# Patient Record
Sex: Female | Born: 1965 | Race: White | Hispanic: No | Marital: Married | Smoking: Former smoker
Health system: Southern US, Community
[De-identification: ages and names within clinical notes are randomized; demographics above are authoritative.]

## PROBLEM LIST (undated history)

## (undated) DIAGNOSIS — R072 Precordial pain: Secondary | ICD-10-CM

## (undated) DIAGNOSIS — M159 Polyosteoarthritis, unspecified: Secondary | ICD-10-CM

## (undated) DIAGNOSIS — E876 Hypokalemia: Secondary | ICD-10-CM

## (undated) DIAGNOSIS — F411 Generalized anxiety disorder: Secondary | ICD-10-CM

## (undated) DIAGNOSIS — I25119 Atherosclerotic heart disease of native coronary artery with unspecified angina pectoris: Secondary | ICD-10-CM

## (undated) DIAGNOSIS — R0602 Shortness of breath: Secondary | ICD-10-CM

## (undated) DIAGNOSIS — M5442 Lumbago with sciatica, left side: Secondary | ICD-10-CM

## (undated) DIAGNOSIS — M059 Rheumatoid arthritis with rheumatoid factor, unspecified: Secondary | ICD-10-CM

## (undated) DIAGNOSIS — Z720 Tobacco use: Secondary | ICD-10-CM

## (undated) DIAGNOSIS — M0580 Other rheumatoid arthritis with rheumatoid factor of unspecified site: Secondary | ICD-10-CM

## (undated) DIAGNOSIS — G629 Polyneuropathy, unspecified: Secondary | ICD-10-CM

## (undated) DIAGNOSIS — M545 Low back pain, unspecified: Secondary | ICD-10-CM

## (undated) DIAGNOSIS — K219 Gastro-esophageal reflux disease without esophagitis: Secondary | ICD-10-CM

## (undated) DIAGNOSIS — R0600 Dyspnea, unspecified: Secondary | ICD-10-CM

## (undated) DIAGNOSIS — F419 Anxiety disorder, unspecified: Secondary | ICD-10-CM

## (undated) DIAGNOSIS — N924 Excessive bleeding in the premenopausal period: Secondary | ICD-10-CM

## (undated) DIAGNOSIS — I10 Essential (primary) hypertension: Secondary | ICD-10-CM

## (undated) DIAGNOSIS — Z9109 Other allergy status, other than to drugs and biological substances: Secondary | ICD-10-CM

## (undated) DIAGNOSIS — E785 Hyperlipidemia, unspecified: Secondary | ICD-10-CM

## (undated) DIAGNOSIS — E7800 Pure hypercholesterolemia, unspecified: Secondary | ICD-10-CM

## (undated) DIAGNOSIS — J309 Allergic rhinitis, unspecified: Secondary | ICD-10-CM

## (undated) HISTORY — DX: Hyperlipidemia, unspecified: E78.5

## (undated) HISTORY — DX: Pure hypercholesterolemia, unspecified: E78.00

## (undated) HISTORY — DX: Polyosteoarthritis, unspecified: M15.9

## (undated) HISTORY — PX: CARDIAC CATHETERIZATION: SHX172

## (undated) HISTORY — DX: Other rheumatoid arthritis with rheumatoid factor of unspecified site: M05.80

## (undated) HISTORY — DX: Anxiety disorder, unspecified: F41.9

## (undated) HISTORY — DX: Low back pain, unspecified: M54.50

## (undated) HISTORY — DX: Allergic rhinitis, unspecified: J30.9

## (undated) HISTORY — PX: OTHER SURGICAL HISTORY: SHX169

## (undated) HISTORY — DX: Polyneuropathy, unspecified: G62.9

## (undated) HISTORY — DX: Dyspnea, unspecified: R06.00

## (undated) HISTORY — DX: Rheumatoid arthritis with rheumatoid factor, unspecified: M05.9

## (undated) HISTORY — DX: Gastro-esophageal reflux disease without esophagitis: K21.9

## (undated) HISTORY — DX: Other allergy status, other than to drugs and biological substances: Z91.09

## (undated) HISTORY — DX: Lumbago with sciatica, left side: M54.42

---
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
## 1997-09-07 ENCOUNTER — Ambulatory Visit (HOSPITAL_COMMUNITY): Admission: RE | Admit: 1997-09-07 | Discharge: 1997-09-07 | Payer: Self-pay | Admitting: *Deleted

## 1997-09-24 ENCOUNTER — Encounter: Admission: RE | Admit: 1997-09-24 | Discharge: 1997-09-24 | Payer: Self-pay | Admitting: Family Medicine

## 1997-09-26 ENCOUNTER — Encounter: Admission: RE | Admit: 1997-09-26 | Discharge: 1997-12-25 | Payer: Self-pay | Admitting: *Deleted

## 1997-10-25 ENCOUNTER — Encounter: Admission: RE | Admit: 1997-10-25 | Discharge: 1997-10-25 | Payer: Self-pay | Admitting: Family Medicine

## 1997-12-24 ENCOUNTER — Encounter: Admission: RE | Admit: 1997-12-24 | Discharge: 1997-12-24 | Payer: Self-pay | Admitting: Family Medicine

## 1998-01-10 ENCOUNTER — Encounter: Admission: RE | Admit: 1998-01-10 | Discharge: 1998-01-10 | Payer: Self-pay | Admitting: Family Medicine

## 1998-04-13 ENCOUNTER — Inpatient Hospital Stay (HOSPITAL_COMMUNITY): Admission: AD | Admit: 1998-04-13 | Discharge: 1998-04-13 | Payer: Self-pay | Admitting: *Deleted

## 1998-04-13 ENCOUNTER — Encounter: Payer: Self-pay | Admitting: *Deleted

## 1998-04-24 ENCOUNTER — Encounter: Admission: RE | Admit: 1998-04-24 | Discharge: 1998-04-24 | Payer: Self-pay | Admitting: Family Medicine

## 1998-05-13 ENCOUNTER — Encounter: Admission: RE | Admit: 1998-05-13 | Discharge: 1998-05-13 | Payer: Self-pay | Admitting: Family Medicine

## 1998-05-13 ENCOUNTER — Other Ambulatory Visit: Admission: RE | Admit: 1998-05-13 | Discharge: 1998-05-13 | Payer: Self-pay | Admitting: *Deleted

## 1998-06-12 ENCOUNTER — Encounter: Admission: RE | Admit: 1998-06-12 | Discharge: 1998-06-12 | Payer: Self-pay | Admitting: Sports Medicine

## 1998-06-19 ENCOUNTER — Encounter: Admission: RE | Admit: 1998-06-19 | Discharge: 1998-06-19 | Payer: Self-pay | Admitting: Family Medicine

## 1998-07-18 ENCOUNTER — Ambulatory Visit (HOSPITAL_COMMUNITY): Admission: RE | Admit: 1998-07-18 | Discharge: 1998-07-18 | Payer: Self-pay | Admitting: Family Medicine

## 1998-08-01 ENCOUNTER — Encounter: Admission: RE | Admit: 1998-08-01 | Discharge: 1998-08-01 | Payer: Self-pay | Admitting: Family Medicine

## 1998-08-22 ENCOUNTER — Encounter: Admission: RE | Admit: 1998-08-22 | Discharge: 1998-08-22 | Payer: Self-pay | Admitting: Family Medicine

## 1998-08-29 ENCOUNTER — Inpatient Hospital Stay (HOSPITAL_COMMUNITY): Admission: AD | Admit: 1998-08-29 | Discharge: 1998-08-29 | Payer: Self-pay | Admitting: Obstetrics & Gynecology

## 1998-09-11 ENCOUNTER — Encounter: Admission: RE | Admit: 1998-09-11 | Discharge: 1998-09-11 | Payer: Self-pay | Admitting: Family Medicine

## 1998-09-25 ENCOUNTER — Encounter: Payer: Self-pay | Admitting: Family Medicine

## 1998-09-25 ENCOUNTER — Inpatient Hospital Stay (HOSPITAL_COMMUNITY): Admission: RE | Admit: 1998-09-25 | Discharge: 1998-09-25 | Payer: Self-pay | Admitting: Family Medicine

## 1998-10-03 ENCOUNTER — Encounter: Admission: RE | Admit: 1998-10-03 | Discharge: 1998-10-03 | Payer: Self-pay | Admitting: Family Medicine

## 1998-10-03 ENCOUNTER — Inpatient Hospital Stay (HOSPITAL_COMMUNITY): Admission: AD | Admit: 1998-10-03 | Discharge: 1998-10-03 | Payer: Self-pay | Admitting: Obstetrics & Gynecology

## 1998-10-07 ENCOUNTER — Encounter: Admission: RE | Admit: 1998-10-07 | Discharge: 1998-10-07 | Payer: Self-pay | Admitting: Family Medicine

## 1998-10-10 ENCOUNTER — Encounter: Admission: RE | Admit: 1998-10-10 | Discharge: 1998-10-10 | Payer: Self-pay | Admitting: Family Medicine

## 1998-10-14 ENCOUNTER — Encounter: Admission: RE | Admit: 1998-10-14 | Discharge: 1998-10-14 | Payer: Self-pay | Admitting: Family Medicine

## 1998-10-16 ENCOUNTER — Encounter: Admission: RE | Admit: 1998-10-16 | Discharge: 1998-10-16 | Payer: Self-pay | Admitting: Family Medicine

## 1998-11-04 ENCOUNTER — Encounter: Admission: RE | Admit: 1998-11-04 | Discharge: 1998-11-04 | Payer: Self-pay | Admitting: Family Medicine

## 1998-11-10 ENCOUNTER — Inpatient Hospital Stay (HOSPITAL_COMMUNITY): Admission: AD | Admit: 1998-11-10 | Discharge: 1998-11-10 | Payer: Self-pay | Admitting: Obstetrics

## 1998-11-13 ENCOUNTER — Encounter: Admission: RE | Admit: 1998-11-13 | Discharge: 1998-11-13 | Payer: Self-pay | Admitting: Family Medicine

## 1998-11-17 ENCOUNTER — Inpatient Hospital Stay (HOSPITAL_COMMUNITY): Admission: AD | Admit: 1998-11-17 | Discharge: 1998-11-19 | Payer: Self-pay | Admitting: *Deleted

## 1998-11-20 ENCOUNTER — Encounter: Admission: RE | Admit: 1998-11-20 | Discharge: 1998-11-20 | Payer: Self-pay | Admitting: Family Medicine

## 1998-11-27 ENCOUNTER — Encounter: Admission: RE | Admit: 1998-11-27 | Discharge: 1998-11-27 | Payer: Self-pay | Admitting: Family Medicine

## 1999-02-27 ENCOUNTER — Other Ambulatory Visit: Admission: RE | Admit: 1999-02-27 | Discharge: 1999-02-27 | Payer: Self-pay | Admitting: *Deleted

## 1999-02-27 ENCOUNTER — Encounter: Admission: RE | Admit: 1999-02-27 | Discharge: 1999-02-27 | Payer: Self-pay | Admitting: Family Medicine

## 1999-03-18 ENCOUNTER — Encounter: Admission: RE | Admit: 1999-03-18 | Discharge: 1999-03-18 | Payer: Self-pay | Admitting: Family Medicine

## 1999-04-04 ENCOUNTER — Encounter: Admission: RE | Admit: 1999-04-04 | Discharge: 1999-04-04 | Payer: Self-pay | Admitting: Family Medicine

## 2003-06-14 ENCOUNTER — Emergency Department (HOSPITAL_COMMUNITY): Admission: EM | Admit: 2003-06-14 | Discharge: 2003-06-14 | Payer: Self-pay | Admitting: Emergency Medicine

## 2003-07-26 ENCOUNTER — Ambulatory Visit (HOSPITAL_COMMUNITY): Admission: RE | Admit: 2003-07-26 | Discharge: 2003-07-26 | Payer: Self-pay | Admitting: Family Medicine

## 2003-11-19 ENCOUNTER — Encounter: Admission: RE | Admit: 2003-11-19 | Discharge: 2003-12-19 | Payer: Self-pay | Admitting: Family Medicine

## 2003-12-26 ENCOUNTER — Ambulatory Visit (HOSPITAL_COMMUNITY): Admission: RE | Admit: 2003-12-26 | Discharge: 2003-12-26 | Payer: Self-pay | Admitting: Family Medicine

## 2004-01-14 ENCOUNTER — Ambulatory Visit (HOSPITAL_COMMUNITY): Admission: RE | Admit: 2004-01-14 | Discharge: 2004-01-14 | Payer: Self-pay | Admitting: Family Medicine

## 2004-05-06 ENCOUNTER — Ambulatory Visit: Payer: Self-pay | Admitting: Family Medicine

## 2004-06-24 ENCOUNTER — Ambulatory Visit: Payer: Self-pay | Admitting: Family Medicine

## 2004-07-10 ENCOUNTER — Ambulatory Visit: Payer: Self-pay | Admitting: Family Medicine

## 2004-07-30 ENCOUNTER — Ambulatory Visit (HOSPITAL_COMMUNITY): Admission: RE | Admit: 2004-07-30 | Discharge: 2004-07-30 | Payer: Self-pay | Admitting: Family Medicine

## 2004-09-11 ENCOUNTER — Ambulatory Visit: Payer: Self-pay | Admitting: Family Medicine

## 2005-09-28 ENCOUNTER — Ambulatory Visit: Payer: Self-pay | Admitting: Family Medicine

## 2005-10-02 ENCOUNTER — Ambulatory Visit (HOSPITAL_COMMUNITY): Admission: RE | Admit: 2005-10-02 | Discharge: 2005-10-02 | Payer: Self-pay | Admitting: Family Medicine

## 2006-07-26 ENCOUNTER — Ambulatory Visit: Payer: Self-pay | Admitting: Family Medicine

## 2006-08-16 ENCOUNTER — Ambulatory Visit (HOSPITAL_COMMUNITY): Admission: RE | Admit: 2006-08-16 | Discharge: 2006-08-16 | Payer: Self-pay | Admitting: Family Medicine

## 2007-01-21 ENCOUNTER — Ambulatory Visit: Payer: Self-pay | Admitting: Family Medicine

## 2007-01-27 ENCOUNTER — Ambulatory Visit (HOSPITAL_COMMUNITY): Admission: RE | Admit: 2007-01-27 | Discharge: 2007-01-27 | Payer: Self-pay | Admitting: Family Medicine

## 2007-02-15 ENCOUNTER — Ambulatory Visit: Payer: Self-pay | Admitting: Family Medicine

## 2007-03-24 ENCOUNTER — Ambulatory Visit: Payer: Self-pay | Admitting: Family Medicine

## 2007-04-07 ENCOUNTER — Ambulatory Visit (HOSPITAL_COMMUNITY): Admission: RE | Admit: 2007-04-07 | Discharge: 2007-04-07 | Payer: Self-pay | Admitting: Family Medicine

## 2007-05-23 ENCOUNTER — Encounter (INDEPENDENT_AMBULATORY_CARE_PROVIDER_SITE_OTHER): Payer: Self-pay | Admitting: Family Medicine

## 2007-05-23 LAB — CONVERTED CEMR LAB
Amphetamine Screen, Ur: NEGATIVE
Benzodiazepines.: NEGATIVE
Cocaine Metabolites: NEGATIVE
Marijuana Metabolite: POSITIVE — AB
Opiate Screen, Urine: NEGATIVE

## 2007-08-23 ENCOUNTER — Encounter: Admission: RE | Admit: 2007-08-23 | Discharge: 2007-08-23 | Payer: Self-pay | Admitting: Specialist

## 2007-11-07 ENCOUNTER — Emergency Department (HOSPITAL_COMMUNITY): Admission: EM | Admit: 2007-11-07 | Discharge: 2007-11-08 | Payer: Self-pay | Admitting: Emergency Medicine

## 2007-11-08 ENCOUNTER — Ambulatory Visit (HOSPITAL_COMMUNITY): Admission: RE | Admit: 2007-11-08 | Discharge: 2007-11-08 | Payer: Self-pay | Admitting: Family Medicine

## 2007-11-10 ENCOUNTER — Ambulatory Visit: Payer: Self-pay | Admitting: Family Medicine

## 2007-11-10 LAB — CONVERTED CEMR LAB
Barbiturate Quant, Ur: NEGATIVE
Cocaine Metabolites: NEGATIVE
Creatinine,U: 99.7 mg/dL
Marijuana Metabolite: POSITIVE — AB
Phencyclidine (PCP): NEGATIVE

## 2007-11-11 ENCOUNTER — Ambulatory Visit (HOSPITAL_COMMUNITY): Admission: RE | Admit: 2007-11-11 | Discharge: 2007-11-11 | Payer: Self-pay | Admitting: Family Medicine

## 2008-04-11 ENCOUNTER — Emergency Department (HOSPITAL_COMMUNITY): Admission: EM | Admit: 2008-04-11 | Discharge: 2008-04-11 | Payer: Self-pay | Admitting: Family Medicine

## 2008-04-22 ENCOUNTER — Emergency Department (HOSPITAL_COMMUNITY): Admission: EM | Admit: 2008-04-22 | Discharge: 2008-04-22 | Payer: Self-pay | Admitting: Emergency Medicine

## 2008-06-12 ENCOUNTER — Emergency Department (HOSPITAL_COMMUNITY): Admission: EM | Admit: 2008-06-12 | Discharge: 2008-06-12 | Payer: Self-pay | Admitting: Family Medicine

## 2008-08-24 ENCOUNTER — Encounter
Admission: RE | Admit: 2008-08-24 | Discharge: 2008-08-24 | Payer: Self-pay | Admitting: Physical Medicine & Rehabilitation

## 2009-04-05 ENCOUNTER — Emergency Department (HOSPITAL_COMMUNITY): Admission: EM | Admit: 2009-04-05 | Discharge: 2009-04-05 | Payer: Self-pay | Admitting: Emergency Medicine

## 2009-12-26 ENCOUNTER — Emergency Department (HOSPITAL_COMMUNITY): Admission: EM | Admit: 2009-12-26 | Discharge: 2009-12-26 | Payer: Self-pay | Admitting: Family Medicine

## 2010-07-06 ENCOUNTER — Encounter: Payer: Self-pay | Admitting: Physical Medicine and Rehabilitation

## 2010-07-06 ENCOUNTER — Encounter: Payer: Self-pay | Admitting: Family Medicine

## 2010-07-06 ENCOUNTER — Encounter: Payer: Self-pay | Admitting: Specialist

## 2012-07-13 ENCOUNTER — Emergency Department (HOSPITAL_COMMUNITY): Admission: EM | Admit: 2012-07-13 | Discharge: 2012-07-13 | Discharge status: Against Medical Advice | Payer: Self-pay

## 2014-12-19 ENCOUNTER — Encounter: Payer: Self-pay | Admitting: *Deleted

## 2014-12-19 ENCOUNTER — Other Ambulatory Visit: Payer: Self-pay | Admitting: *Deleted

## 2014-12-19 ENCOUNTER — Telehealth: Payer: Self-pay | Admitting: *Deleted

## 2014-12-19 DIAGNOSIS — N921 Excessive and frequent menstruation with irregular cycle: Secondary | ICD-10-CM

## 2014-12-19 NOTE — Telephone Encounter (Signed)
Attempted to contact patient to inform of referral and ultrasound appointment, no answer, unable to leave a message.  Will send a letter.

## 2014-12-19 NOTE — Progress Notes (Signed)
Ultrasound appointment scheduled for January 02, 2015 @0800 .

## 2014-12-25 ENCOUNTER — Encounter: Payer: Self-pay | Admitting: *Deleted

## 2014-12-31 ENCOUNTER — Ambulatory Visit (HOSPITAL_COMMUNITY)
Admission: RE | Admit: 2014-12-31 | Discharge: 2014-12-31 | Disposition: A | Discharge status: Home / Self Care | Payer: Medicaid Other | Source: Ambulatory Visit | Attending: Obstetrics & Gynecology | Admitting: Obstetrics & Gynecology

## 2014-12-31 ENCOUNTER — Ambulatory Visit (HOSPITAL_COMMUNITY): Admission: RE | Admit: 2014-12-31 | Payer: Medicaid Other | Source: Ambulatory Visit

## 2014-12-31 ENCOUNTER — Other Ambulatory Visit (HOSPITAL_COMMUNITY): Payer: Medicaid Other

## 2014-12-31 DIAGNOSIS — N921 Excessive and frequent menstruation with irregular cycle: Secondary | ICD-10-CM

## 2014-12-31 DIAGNOSIS — N939 Abnormal uterine and vaginal bleeding, unspecified: Secondary | ICD-10-CM | POA: Insufficient documentation

## 2015-01-01 ENCOUNTER — Ambulatory Visit (HOSPITAL_COMMUNITY): Payer: Medicaid Other

## 2015-01-02 ENCOUNTER — Ambulatory Visit (HOSPITAL_COMMUNITY): Payer: Self-pay

## 2015-01-09 ENCOUNTER — Other Ambulatory Visit (HOSPITAL_COMMUNITY)
Admission: RE | Admit: 2015-01-09 | Discharge: 2015-01-09 | Disposition: A | Discharge status: Home / Self Care | Payer: Medicaid Other | Source: Ambulatory Visit | Attending: Obstetrics & Gynecology | Admitting: Obstetrics & Gynecology

## 2015-01-09 ENCOUNTER — Encounter: Payer: Self-pay | Admitting: Obstetrics & Gynecology

## 2015-01-09 ENCOUNTER — Ambulatory Visit (INDEPENDENT_AMBULATORY_CARE_PROVIDER_SITE_OTHER): Payer: Medicaid Other | Admitting: Obstetrics & Gynecology

## 2015-01-09 VITALS — BP 129/78 | HR 67 | Temp 97.8°F | Ht 67.0 in | Wt 179.2 lb

## 2015-01-09 DIAGNOSIS — N924 Excessive bleeding in the premenopausal period: Secondary | ICD-10-CM | POA: Diagnosis not present

## 2015-01-09 DIAGNOSIS — Z1151 Encounter for screening for human papillomavirus (HPV): Secondary | ICD-10-CM | POA: Insufficient documentation

## 2015-01-09 DIAGNOSIS — Z01419 Encounter for gynecological examination (general) (routine) without abnormal findings: Secondary | ICD-10-CM | POA: Insufficient documentation

## 2015-01-09 DIAGNOSIS — Z139 Encounter for screening, unspecified: Secondary | ICD-10-CM

## 2015-01-09 HISTORY — DX: Excessive bleeding in the premenopausal period: N92.4

## 2015-01-09 LAB — CBC
HEMATOCRIT: 46.3 % — AB (ref 36.0–46.0)
Hemoglobin: 16 g/dL — ABNORMAL HIGH (ref 12.0–15.0)
MCH: 32.9 pg (ref 26.0–34.0)
MCHC: 34.6 g/dL (ref 30.0–36.0)
MCV: 95.1 fL (ref 78.0–100.0)
MPV: 10.3 fL (ref 8.6–12.4)
Platelets: 151 10*3/uL (ref 150–400)
RBC: 4.87 MIL/uL (ref 3.87–5.11)
RDW: 13.3 % (ref 11.5–15.5)
WBC: 4.9 10*3/uL (ref 4.0–10.5)

## 2015-01-09 LAB — POCT PREGNANCY, URINE: PREG TEST UR: NEGATIVE

## 2015-01-09 MED ORDER — MEDROXYPROGESTERONE ACETATE 10 MG PO TABS
10.0000 mg | ORAL_TABLET | Freq: Every day | ORAL | Status: DC
Start: 2015-01-09 — End: 2021-09-04

## 2015-01-09 NOTE — Progress Notes (Signed)
Patient ID: Shelby Huffman, female   DOB: 10-05-1965, 49 y.o.   MRN: [id number]  Chief Complaint  Patient presents with  . Menorrhagia  bled for more than a month 10/2014-12/2014  HPI Shelby Huffman is a 49 y.o. female.  [id number], Patient's last menstrual period was 12/20/2014.'  Menses stopped early July after bleeding from late May til then. HPI  Past Medical History  Diagnosis Date  . Hypercholesteremia   . Anxiety   . Environmental allergies     Past Surgical History  Procedure Laterality Date  . Bilateral tubal ligation      Family History  Problem Relation Age of Onset  . Breast cancer Mother   . Hypertension Father     Social History History  Substance Use Topics  . Smoking status: Current Every Day Smoker -- 1.00 packs/day    Types: Cigarettes  . Smokeless tobacco: Never Used  . Alcohol Use: No    Allergies  Allergen Reactions  . Penicillins Hives    Current Outpatient Prescriptions  Medication Sig Dispense Refill  . ALPRAZolam (XANAX) 1 MG tablet Take 1 mg by mouth 3 (three) times daily.  0  . hydrochlorothiazide (HYDRODIURIL) 25 MG tablet Take 25 mg by mouth daily.  5  . HYDROcodone-acetaminophen (NORCO) 10-325 MG per tablet Take 1 tablet by mouth 3 (three) times daily.  0  . loratadine (CLARITIN) 10 MG tablet Take 10 mg by mouth daily.  5  . metoprolol tartrate (LOPRESSOR) 25 MG tablet Take 25 mg by mouth daily.  0  . pravastatin (PRAVACHOL) 20 MG tablet Take 20 mg by mouth at bedtime.  5  . medroxyPROGESTERone (PROVERA) 10 MG tablet Take 1 tablet (10 mg total) by mouth daily. Use for ten days if 6-8 weeks since last period 10 tablet 2   No current facility-administered medications for this visit.    Review of Systems Review of Systems  Respiratory: Negative.   Cardiovascular: Negative.   Gastrointestinal: Negative.   Genitourinary: Positive for menstrual problem. Negative for vaginal bleeding and vaginal discharge.    Blood pressure  129/78, pulse 67, temperature 97.8 F (36.6 C), height  (1.702 m), weight 179 lb 3.2 oz (81.285 kg), last menstrual period 12/20/2014.  Physical Exam Physical Exam  Constitutional: She is oriented to person, place, and time. She appears well-developed. No distress.  Pulmonary/Chest: Effort normal. No respiratory distress.  Abdominal: Soft.  Genitourinary: Uterus normal. Vaginal discharge found.  Pelvic exam: normal external genitalia, vulva, vagina, cervix, uterus and adnexa.   Neurological: She is alert and oriented to person, place, and time.  Skin: Skin is warm and dry.  Psychiatric: She has a normal mood and affect. Her behavior is normal.    Data Reviewed CLINICAL DATA: Initial encounter for abnormal uterine bleeding.  EXAM: TRANSABDOMINAL AND TRANSVAGINAL ULTRASOUND OF PELVIS  TECHNIQUE: Both transabdominal and transvaginal ultrasound examinations of the pelvis were performed. Transabdominal technique was performed for global imaging of the pelvis including uterus, ovaries, adnexal regions, and pelvic cul-de-sac. It was necessary to proceed with endovaginal exam following the transabdominal exam to visualize the endometrium.  COMPARISON: 04/07/2007  FINDINGS: Uterus  Measurements: 11.6 x 5.5 x 7.3 cm. No fibroids or other mass visualized.  Endometrium  Thickness: 12 mm. No focal abnormality visualized.  Right ovary  Measurements: 2.5 x 2.0 x 1.8 cm. Normal appearance/no adnexal mass.  Left ovary  Measurements: 2.4 x 1.3 x 2.3 cm. Normal appearance/no adnexal mass.  Other findings  No free  fluid.  IMPRESSION: 12 mm endometrial stripe thickness in a premenopausal female. If bleeding remains unresponsive to hormonal or medical therapy, sonohysterogram should be considered for focal lesion work-up. (Ref: Radiological Reasoning: Algorithmic Workup of Abnormal Vaginal Bleeding with Endovaginal Sonography and Sonohysterography.  AJR 2008; 130:Q65-78)   Electronically Signed  By: Kennith Center M.D.  On: 12/31/2014 10:44  Assessment   Perimenopausal DUB currently resolved   Plan    FSH, CBC Provera 10 mg 10 days for missed period for 6-8 weeks RTC 6 mo to review        Shelby Huffman 01/09/2015, 1:44 PM

## 2015-01-09 NOTE — Patient Instructions (Signed)

## 2015-01-10 LAB — FOLLICLE STIMULATING HORMONE: FSH: 9.3 m[IU]/mL

## 2015-01-11 ENCOUNTER — Ambulatory Visit (HOSPITAL_COMMUNITY)
Admission: RE | Admit: 2015-01-11 | Discharge: 2015-01-11 | Disposition: A | Discharge status: Home / Self Care | Payer: Medicaid Other | Source: Ambulatory Visit | Attending: Obstetrics & Gynecology | Admitting: Obstetrics & Gynecology

## 2015-01-11 DIAGNOSIS — Z139 Encounter for screening, unspecified: Secondary | ICD-10-CM

## 2015-01-11 DIAGNOSIS — Z1231 Encounter for screening mammogram for malignant neoplasm of breast: Secondary | ICD-10-CM | POA: Insufficient documentation

## 2015-01-11 DIAGNOSIS — N924 Excessive bleeding in the premenopausal period: Secondary | ICD-10-CM

## 2015-01-14 ENCOUNTER — Telehealth: Payer: Self-pay | Admitting: *Deleted

## 2015-01-14 LAB — CYTOLOGY - PAP

## 2015-01-14 NOTE — Telephone Encounter (Signed)
-----   Message from Adam Phenix, MD sent at 01/13/2015  3:39 PM EDT ----- [facility] premenopausal

## 2015-01-14 NOTE — Telephone Encounter (Signed)
Contacted patient, informed of lab result.  Pt verbalizes understanding and has no further questions.

## 2016-03-05 DIAGNOSIS — G8929 Other chronic pain: Secondary | ICD-10-CM

## 2016-03-05 HISTORY — DX: Other chronic pain: G89.29

## 2016-04-15 DIAGNOSIS — F411 Generalized anxiety disorder: Secondary | ICD-10-CM

## 2016-04-15 HISTORY — DX: Generalized anxiety disorder: F41.1

## 2016-05-17 DIAGNOSIS — E876 Hypokalemia: Secondary | ICD-10-CM | POA: Insufficient documentation

## 2016-05-17 DIAGNOSIS — I10 Essential (primary) hypertension: Secondary | ICD-10-CM | POA: Insufficient documentation

## 2016-05-17 DIAGNOSIS — R0602 Shortness of breath: Secondary | ICD-10-CM | POA: Insufficient documentation

## 2016-05-17 DIAGNOSIS — E785 Hyperlipidemia, unspecified: Secondary | ICD-10-CM | POA: Insufficient documentation

## 2016-05-17 DIAGNOSIS — R072 Precordial pain: Secondary | ICD-10-CM | POA: Insufficient documentation

## 2016-05-17 DIAGNOSIS — Z720 Tobacco use: Secondary | ICD-10-CM

## 2016-05-17 HISTORY — DX: Tobacco use: Z72.0

## 2016-05-17 HISTORY — DX: Hypokalemia: E87.6

## 2016-05-17 HISTORY — DX: Essential (primary) hypertension: I10

## 2016-05-17 HISTORY — DX: Shortness of breath: R06.02

## 2016-05-17 HISTORY — DX: Hyperlipidemia, unspecified: E78.5

## 2016-05-17 HISTORY — DX: Precordial pain: R07.2

## 2016-05-25 IMAGING — US US TRANSVAGINAL NON-OB
1 series · 13 of 25 positions shown · non-contrast
Comparison: 04/07/2007

CLINICAL DATA: Initial encounter for abnormal uterine bleeding.



[Series 1: us non-ob tv/pel · 13 of 48 slices shown]
[im 1/48]
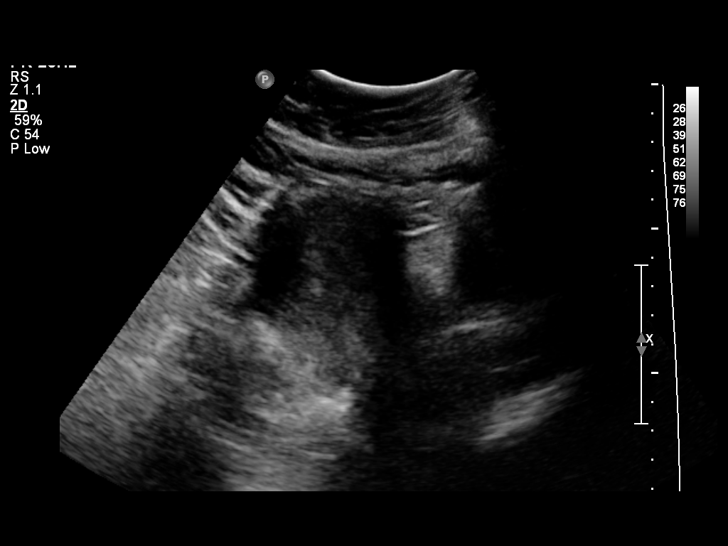
[im 4/48]
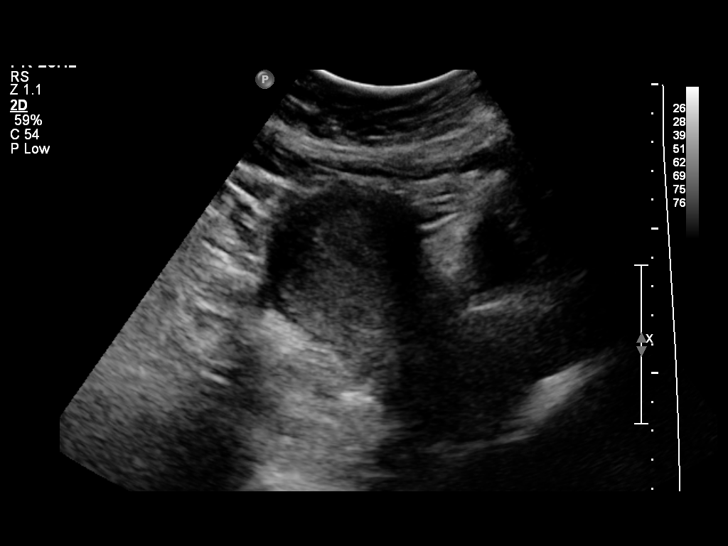
[im 8/48]
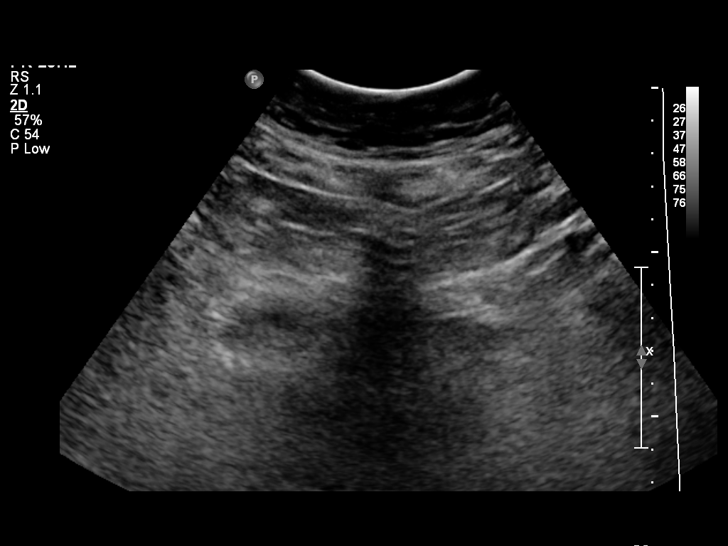
[im 12/48]
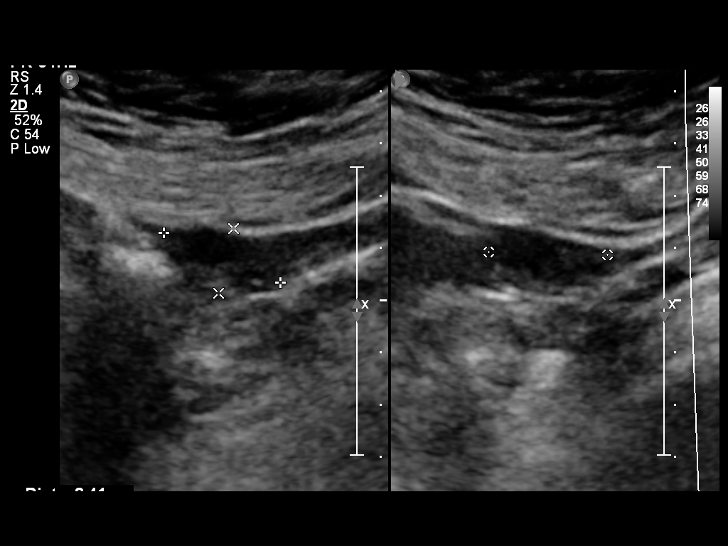
[im 16/48]
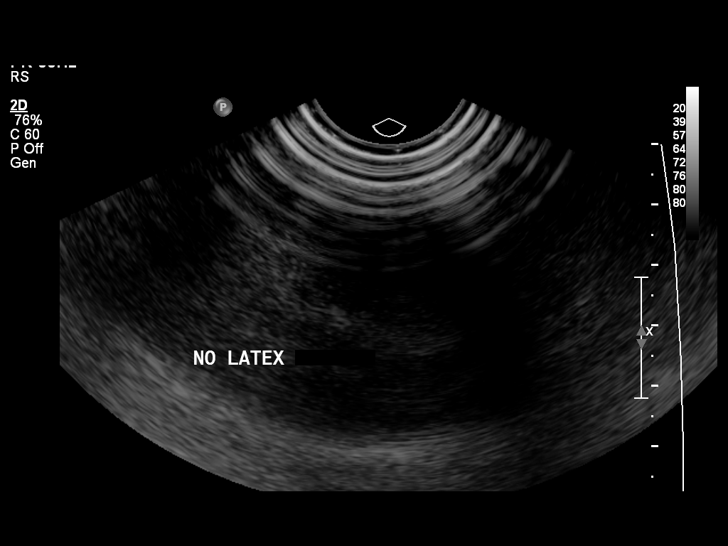
[im 20/48]
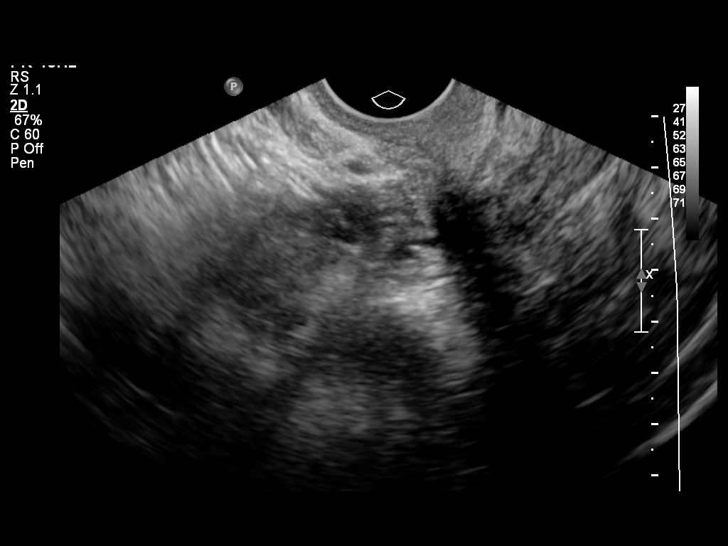
[im 24/48]
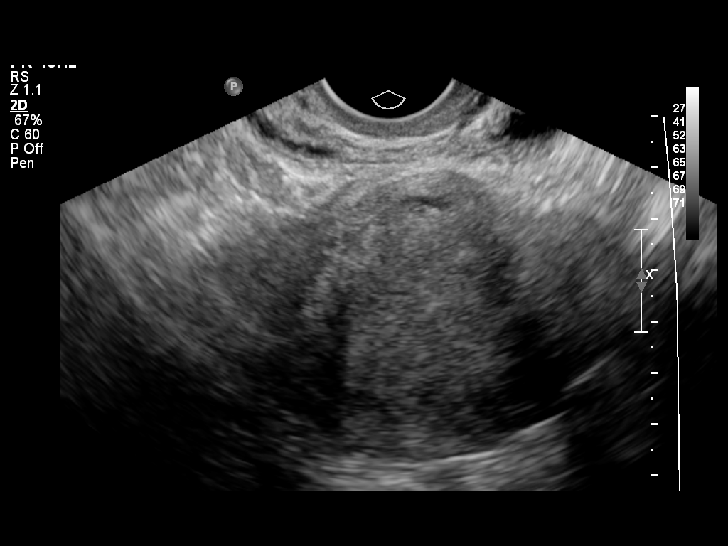
[im 28/48]
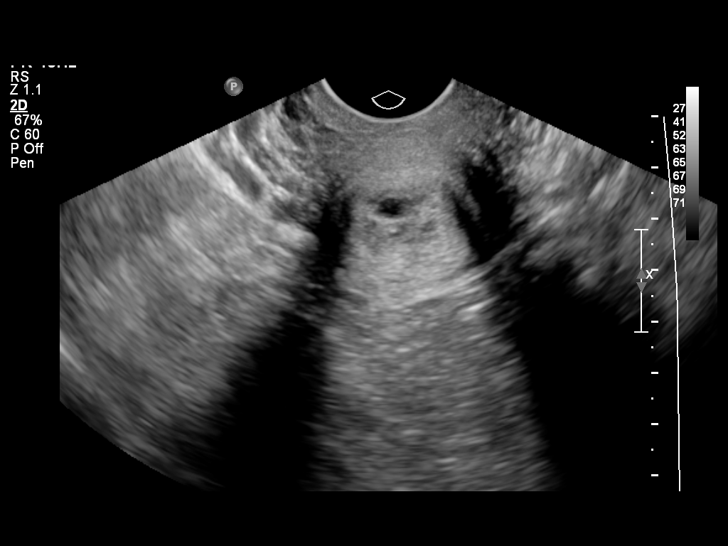
[im 32/48]
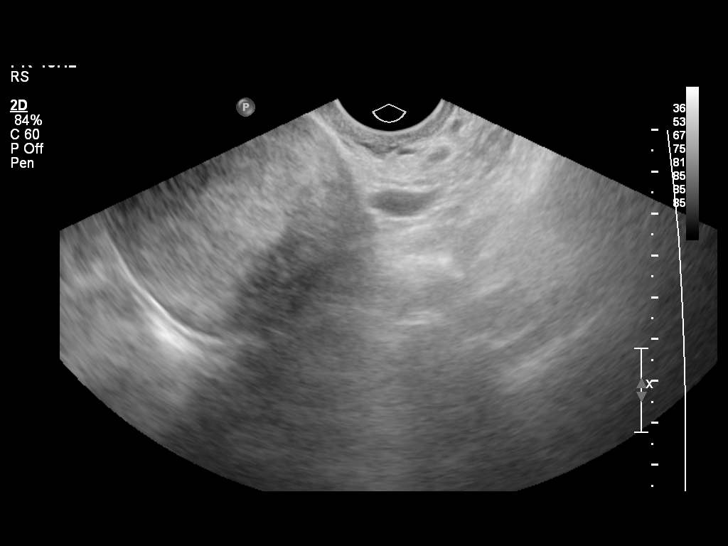
[im 36/48]
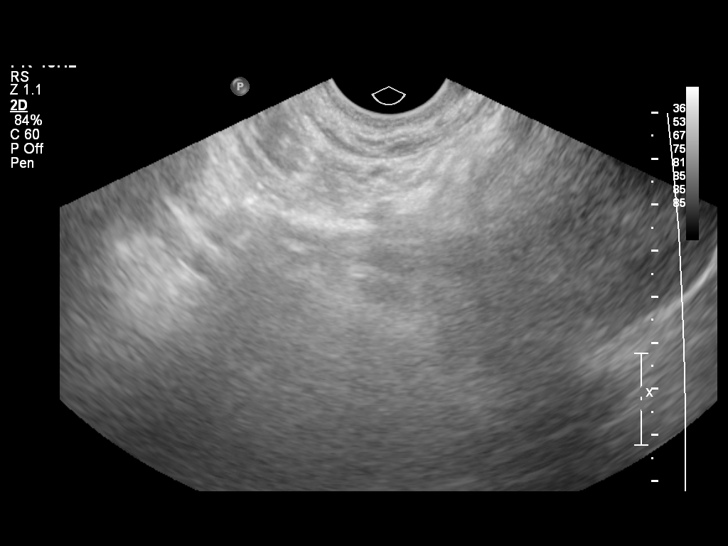
[im 40/48]
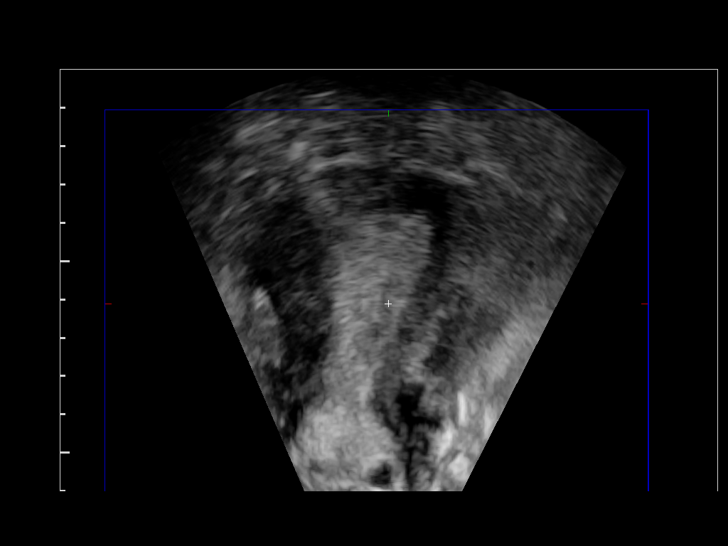
[im 44/48]
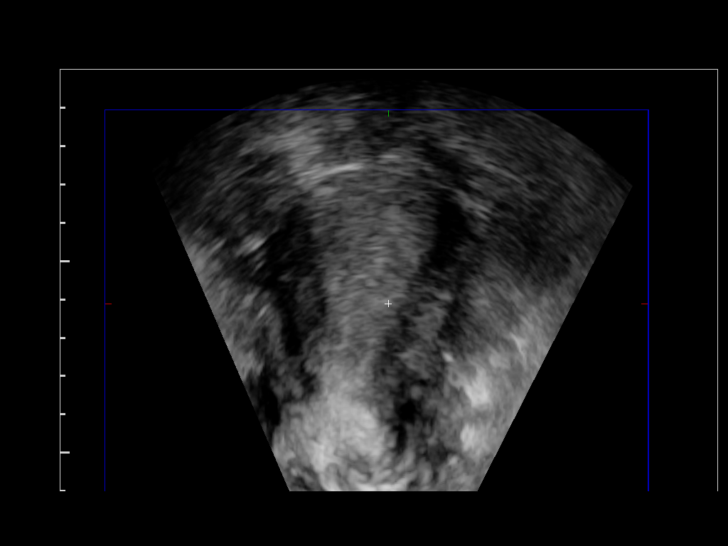
[im 48/48]
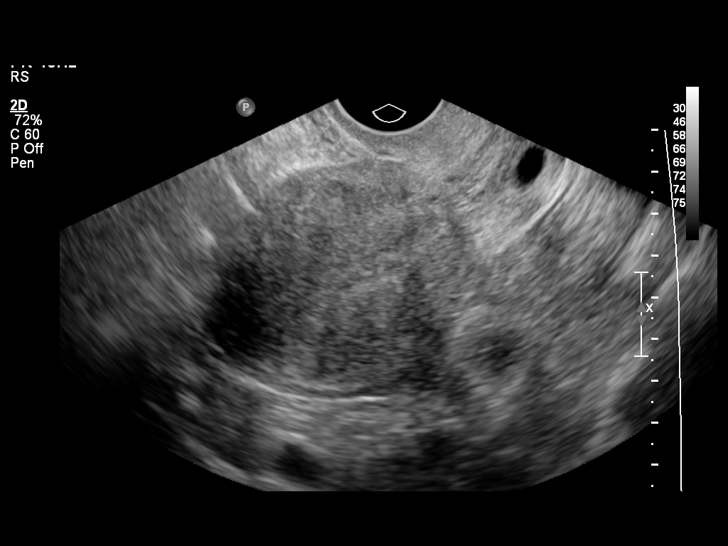

[13 of 25 positions shown; findings below may reference images not displayed]

FINDINGS: Uterus

Measurements: 11.6 x 5.5 x 7.3 cm. No fibroids or other mass
visualized.

Endometrium

Thickness: 12 mm.  No focal abnormality visualized.

Right ovary

Measurements: 2.5 x 2.0 x 1.8 cm. Normal appearance/no adnexal mass.

Left ovary

Measurements: 2.4 x 1.3 x 2.3 cm. Normal appearance/no adnexal mass.

Other findings

No free fluid.
IMPRESSION: 12 mm endometrial stripe thickness in a premenopausal female. If
bleeding remains unresponsive to hormonal or medical therapy,
sonohysterogram should be considered for focal lesion work-up. (Ref:
Radiological Reasoning: Algorithmic Workup of Abnormal Vaginal
Bleeding with Endovaginal Sonography and Sonohysterography. AJR
6666; 191:S68-73)

## 2016-06-03 DIAGNOSIS — I25119 Atherosclerotic heart disease of native coronary artery with unspecified angina pectoris: Secondary | ICD-10-CM | POA: Insufficient documentation

## 2016-06-03 HISTORY — DX: Atherosclerotic heart disease of native coronary artery with unspecified angina pectoris: I25.119

## 2019-01-17 ENCOUNTER — Encounter: Payer: Self-pay | Admitting: *Deleted

## 2019-01-17 ENCOUNTER — Other Ambulatory Visit: Payer: Self-pay

## 2019-01-17 ENCOUNTER — Encounter: Payer: Self-pay | Admitting: Cardiology

## 2019-01-17 ENCOUNTER — Ambulatory Visit (INDEPENDENT_AMBULATORY_CARE_PROVIDER_SITE_OTHER): Payer: Medicaid Other | Admitting: Cardiology

## 2019-01-17 VITALS — BP 124/80 | HR 68 | Temp 98.4°F | Ht 67.0 in | Wt 184.0 lb

## 2019-01-17 DIAGNOSIS — E7800 Pure hypercholesterolemia, unspecified: Secondary | ICD-10-CM | POA: Insufficient documentation

## 2019-01-17 DIAGNOSIS — M545 Low back pain, unspecified: Secondary | ICD-10-CM | POA: Insufficient documentation

## 2019-01-17 DIAGNOSIS — I25119 Atherosclerotic heart disease of native coronary artery with unspecified angina pectoris: Secondary | ICD-10-CM

## 2019-01-17 DIAGNOSIS — I10 Essential (primary) hypertension: Secondary | ICD-10-CM

## 2019-01-17 DIAGNOSIS — G629 Polyneuropathy, unspecified: Secondary | ICD-10-CM | POA: Insufficient documentation

## 2019-01-17 DIAGNOSIS — R0602 Shortness of breath: Secondary | ICD-10-CM | POA: Diagnosis not present

## 2019-01-17 DIAGNOSIS — R12 Heartburn: Secondary | ICD-10-CM | POA: Insufficient documentation

## 2019-01-17 DIAGNOSIS — R0600 Dyspnea, unspecified: Secondary | ICD-10-CM | POA: Insufficient documentation

## 2019-01-17 DIAGNOSIS — K219 Gastro-esophageal reflux disease without esophagitis: Secondary | ICD-10-CM | POA: Insufficient documentation

## 2019-01-17 DIAGNOSIS — M159 Polyosteoarthritis, unspecified: Secondary | ICD-10-CM | POA: Insufficient documentation

## 2019-01-17 DIAGNOSIS — Z9109 Other allergy status, other than to drugs and biological substances: Secondary | ICD-10-CM | POA: Insufficient documentation

## 2019-01-17 DIAGNOSIS — F419 Anxiety disorder, unspecified: Secondary | ICD-10-CM | POA: Insufficient documentation

## 2019-01-17 DIAGNOSIS — J309 Allergic rhinitis, unspecified: Secondary | ICD-10-CM | POA: Insufficient documentation

## 2019-01-17 DIAGNOSIS — M059 Rheumatoid arthritis with rheumatoid factor, unspecified: Secondary | ICD-10-CM | POA: Insufficient documentation

## 2019-01-17 DIAGNOSIS — M0580 Other rheumatoid arthritis with rheumatoid factor of unspecified site: Secondary | ICD-10-CM | POA: Insufficient documentation

## 2019-01-17 DIAGNOSIS — R079 Chest pain, unspecified: Secondary | ICD-10-CM

## 2019-01-17 DIAGNOSIS — M5442 Lumbago with sciatica, left side: Secondary | ICD-10-CM | POA: Insufficient documentation

## 2019-01-17 HISTORY — DX: Heartburn: R12

## 2019-01-17 MED ORDER — PANTOPRAZOLE SODIUM 40 MG PO TBEC: 40.0000 mg | DELAYED_RELEASE_TABLET | Freq: Every day | ORAL | 2 refills | Status: DC

## 2019-01-17 MED ORDER — NITROGLYCERIN 0.4 MG SL SUBL: 0.4000 mg | SUBLINGUAL_TABLET | SUBLINGUAL | 11 refills | Status: DC | PRN

## 2019-01-17 NOTE — Progress Notes (Signed)
Cardiology Office Note:    Date:  01/17/2019   ID:  Shelby Huffman, DOB 05/31/1966, MRN [id number]  PCP:  Clelia CroftGeorge, Robert, MD  Cardiologist:  Norman HerrlichBrian Anais Koenen, MD   Referring MD: Clelia CroftGeorge, Robert, MD  ASSESSMENT:    1. SOB (shortness of breath)   2. Coronary artery disease involving native coronary artery of native heart with angina pectoris (HCC)   3. Essential hypertension   4. Hypercholesteremia    PLAN:    In order of problems listed above:  1. Differential diagnosis includes anginal equivalent underlying lung disease especially COPD with impressive cigarette smoking history and noncardiac problems including anemia and thyroid disease.  Further evaluation chest x-ray proBNP level myocardial perfusion study CBC and thyroid. 2. I am concerned that her shortness of breath is anginal equivalent continue current medical treatment including dual antiplatelet and do myocardial perfusion study to evaluate for ischemia.  If abnormal will refer to coronary angiography 3. Stable continue current treatment beta-blocker thiazide diuretic 4. Continue her statin check lipid profile liver function LP(a) 5. Heartburn start a PPI  Next appointment 6 weeks   Medication Adjustments/Labs and Tests Ordered: Current medicines are reviewed at length with the patient today.  Concerns regarding medicines are outlined above.  No orders of the defined types were placed in this encounter.  No orders of the defined types were placed in this encounter.    Chief Complaint  Patient presents with  . Shortness of Breath  . Coronary Artery Disease    History of Present Illness:    Shelby Huffman is a 53 y.o. female with a history of CAD and PCI of LAD 05/17/2016, HTN and HLD who is being seen today for the evaluation of CAD, dyspnea, and to establish cardiology care at the request of Clelia CroftGeorge, Robert, MD. She follows with her PCp for additional history of generalized anxiety disorder, polyosteoarthritis,  rheumatoid arthritis, chronic low back pain. Has been referred to rheumatology.   She is a longstanding tobacco user. Cardiac cath 05/2016 showing severe 2 vessel CAD normal LVEF requiring DES to proximal to mid and mid to distal LAD. Moderate to severe 60-70% mid to distal RCA disease. She was noted to be intolerant to Brilinta and changed to Plavix.   She has not felt well for a long period of time and feels worse recently.  The stressor in her life is grandchildren at home she cares for them.  With emotional upset she develops a sensation in her chest shortness of breath anxiety and has to stop for a minute or 2 to find relief.  She is not short of breath with activity and will quite use the word chest pain.  Overall she does not feel well with weakness and fatigue and she has had no medical care in the last year.  I am concerned she is having recurrent cardiac ischemia for further evaluation should undergo myocardial perfusion studies also check routine labs including liver function renal CBC thyroid lipid profile and a proBNP.  She had a chest x-ray performed if she continues to smoke 1-1/2 packs a day for 38 years.  She is also concerned about bruising and for the time being we will leave her on low-dose aspirin and clopidogrel until we see the results of testing.  No edema orthopnea palpitation or syncope.  Is also have a constant sensation of heartburn in her throat.  Past Medical History:  Diagnosis Date  . Abnormal perimenopausal bleeding 01/09/2015  . Allergic rhinitis   .  Anxiety   . Coronary artery disease involving native coronary artery of native heart with angina pectoris ([place]) 06/03/2016  . Dyslipidemia 05/17/2016  . Dyspnea, unspecified   . Environmental allergies   . Essential hypertension 05/17/2016  . GAD (generalized anxiety disorder) 04/15/2016  . GERD (gastroesophageal reflux disease)   . Hyperlipidemia   . Hypokalemia 05/17/2016  . Low back pain   . Lumbago with sciatica,  left side   . Neuropathy   . Polyosteoarthritis   . Precordial pain 05/17/2016  . Rheumatoid arthritis with rheumatoid factor (HCC)   . SOB (shortness of breath) 05/17/2016  . Tobacco abuse 05/17/2016    Past Surgical History:  Procedure Laterality Date  . bilateral tubal ligation    . CARDIAC CATHETERIZATION     Stent placed    Current Medications: Current Meds  Medication Sig  . albuterol (VENTOLIN HFA) 108 (90 Base) MCG/ACT inhaler Inhale into the lungs.  . ASPIRIN LOW DOSE 81 MG EC tablet Take 81 mg by mouth daily.  . clopidogrel (PLAVIX) 75 MG tablet Take 75 mg by mouth daily.  Marland Kitchen ezetimibe (ZETIA) 10 MG tablet Take 10 mg by mouth daily.  . hydrochlorothiazide (HYDRODIURIL) 25 MG tablet Take 25 mg by mouth daily.  Marland Kitchen KLOR-CON M20 20 MEQ tablet Take 20 mEq by mouth daily. with food  . loratadine (CLARITIN) 10 MG tablet Take 10 mg by mouth daily.  . medroxyPROGESTERone (PROVERA) 10 MG tablet Take 1 tablet (10 mg total) by mouth daily. Use for ten days if 6-8 weeks since last period  . metoprolol succinate (TOPROL-XL) 25 MG 24 hr tablet TAKE 1 TABLET BY MOUTH EVERY DAY  . metoprolol tartrate (LOPRESSOR) 25 MG tablet Take 25 mg by mouth daily.  Marland Kitchen oxyCODONE (ROXICODONE) 15 MG immediate release tablet Take 15 mg by mouth every 8 (eight) hours as needed.  . pravastatin (PRAVACHOL) 20 MG tablet Take 20 mg by mouth at bedtime.  . Vitamin D, Ergocalciferol, (DRISDOL) 1.25 MG (50000 UT) CAPS capsule Take 50,000 Units by mouth once a week.     Allergies:   Atorvastatin, Duloxetine, Sulfamethazine, Penicillins, Morphine, and Sulfa antibiotics   Social History   Socioeconomic History  . Marital status: Married    Spouse name: Not on file  . Number of children: Not on file  . Years of education: Not on file  . Highest education level: Not on file  Occupational History  . Not on file  Social Needs  . Financial resource strain: Not on file  . Food insecurity    Worry: Not on file     Inability: Not on file  . Transportation needs    Medical: Not on file    Non-medical: Not on file  Tobacco Use  . Smoking status: Current Every Day Smoker    Packs/day: 1.00    Types: Cigarettes  . Smokeless tobacco: Never Used  Substance and Sexual Activity  . Alcohol use: No  . Drug use: No  . Sexual activity: Never    Birth control/protection: Surgical  Lifestyle  . Physical activity    Days per week: Not on file    Minutes per session: Not on file  . Stress: Not on file  Relationships  . Social Herbalist on phone: Not on file    Gets together: Not on file    Attends religious service: Not on file    Active member of club or organization: Not on file    Attends  meetings of clubs or organizations: Not on file    Relationship status: Not on file  Other Topics Concern  . Not on file  Social History Narrative  . Not on file     Family History: The patient's family history includes Breast cancer in her mother; Heart disease in her father; Hypertension in her father and mother; Kidney disease in her father.  ROS:   Review of Systems  Constitution: Positive for malaise/fatigue.  HENT: Negative.   Eyes: Negative.   Cardiovascular: Positive for dyspnea on exertion.  Respiratory: Positive for shortness of breath.   Endocrine: Negative.   Hematologic/Lymphatic: Negative.   Skin: Negative.   Musculoskeletal: Negative.   Gastrointestinal: Positive for heartburn.  Genitourinary: Negative.   Neurological: Negative.   Psychiatric/Behavioral: Negative.   Allergic/Immunologic: Negative.    Please see the history of present illness.     All other systems reviewed and are negative.  EKGs/Labs/Other Studies Reviewed:    The following studies were reviewed today:  Left heart cath 06/03/2016:    Hemodynamics: Aortic pressure 115/53 mmHg, left ventricle pressure 160/10 mmHg, left ventricular end-diastolic pressure of 10 mmHg   Coronary anatomy:  1. Left Main  coronary artery: Normal 2. Left anterior descending artery: Proximal to mid eccentric 98% subtotaled disease with TIMI II flow distally. There is another 75% disease in the mid to distal region and another 50% near the apex. There are couple of small diagonal branches seen. 3. Left circumflex artery: Large-caliber vessel in the proximal region and in the mid to distal region is very small vessel. OM1 is a large-caliber trifurcating vessel without any significant disease. 4. Right coronary artery: Large-caliber dominant ectatic vessel which had minor luminal irregularities throughout with mid 25% and another distal 60-70% stenosis followed by aneurysm dilation just before the bifurcation. LV gram performed showed mild apical hypokinesis with left ventricular ejection fraction of more than 55%. No mitral regurgitation noted. No significant gradient across the aortic valve on pullback. Preintervention showed 98% subtotaled stenosis of the proximal to mid LAD which is at type C lesion with TIMI II flow distally. Final result showed 0% residual stenosis with no dissection or intracoronary thrombus seen with TIMI III flow distally. Preintervention showed 75% eccentric stenosis of the mid to distal LAD which is at type C lesion with TIMI III flow distally. Final result showed 0% residual stenosis with no dissection or intracoronary thrombus seen with TIMI III flow distally.  CONCLUSIONS: 1. Severe 2 vessel CAD with culprit vessel LAD. 2. Normal LVEF. 3. Successful PTCA/stent placement x2 of the proximal to mid and mid to distal LAD using Synergy drug-eluting stents.  EKG:  EKG is  ordered today.  The ekg ordered today is personally reviewed and demonstrates sinus rhythm and is normal  Recent Labs: No results found for requested labs within last 8760 hours.   Recent Lipid Panel Component Name 05/18/2016   157         Chol  267 (H)    TG  27 (L)      HDL  77            LDL  53 (H)       VLDL        Physical Exam:    VS:  BP 124/80 (BP Location: Left Arm, Patient Position: Sitting, Cuff Size: Normal)   Pulse 68   Temp 98.4 F (36.9 C)   Ht 5\' 7"  (1.702 m)   Wt 184 lb (83.5 kg)  SpO2 97%   BMI 28.82 kg/m     Wt Readings from Last 3 Encounters:  01/17/19 184 lb (83.5 kg)  01/09/15 179 lb 3.2 oz (81.3 kg)     GEN:  Well nourished, well developed in no acute distress HEENT: Normal NECK: No JVD; No carotid bruits LYMPHATICS: No lymphadenopathy CARDIAC: RRR, no murmurs, rubs, gallops RESPIRATORY:  Clear to auscultation without rales, wheezing or rhonchi  ABDOMEN: Soft, non-tender, non-distended MUSCULOSKELETAL:  No edema; No deformity  SKIN: Warm and dry NEUROLOGIC:  Alert and oriented x 3 PSYCHIATRIC:  Normal affect   Signed, Norman HerrlichBrian Analyn Matusek, MD  01/17/2019 10:20 AM    [facility]

## 2019-01-17 NOTE — Patient Instructions (Addendum)
Medication Instructions:  Your physician has recommended you make the following change in your medication:   Nitroglycerin 0.4 mg sublingual (under your tongue) as needed for chest pain. If experiencing chest pain, stop what you are doing and sit down. Take 1 nitroglycerin and wait 5 minutes. If chest pain continues, take another nitroglycerin and wait 5 minutes. If chest pain does not subside, take 1 more nitroglycerin and dial 911. You make take a total of 3 nitroglycerin in a 15 minute time frame.   START: Protonix 40 mg: Take 1 tab daily with meal  If you need a refill on your cardiac medications before your next appointment, please call your pharmacy.   Lab work: Your physician recommends that you return for lab work in: TODAY CBC,CMP,TSH,Pro BNP,Liproprotein A  If you have labs (blood work) drawn today and your tests are completely normal, you will receive your results only by: Marland Kitchen MyChart Message (if you have MyChart) OR . A paper copy in the mail If you have any lab test that is abnormal or we need to change your treatment, we will call you to review the results.  Testing/Procedures: A chest x-ray takes a picture of the organs and structures inside the chest, including the heart, lungs, and blood vessels. This test can show several things, including, whether the heart is enlarges; whether fluid is building up in the lungs; and whether pacemaker / defibrillator leads are still in place.  AT [facility]  Your physician has requested that you have a lexiscan myoview. For further information please visit [email or web address]. Please follow instruction sheet, as given.  YOUR APPOINTMENT IS  SEPT 9th,2020 AT 11:00 at this office Please arrive at 10:45AM Follow-Up: At [facility], you and your health needs are our priority.  As part of our continuing mission to provide you with exceptional heart care, we have created designated Provider Care Teams.  These Care Teams include your  primary Cardiologist (physician) and Advanced Practice Providers (APPs -  Physician Assistants and Nurse Practitioners) who all work together to provide you with the care you need, when you need it. You will need a follow up appointment in 4 weeks.  Please call our office 2 months in advance to schedule this appointment.  You may see No primary care provider on file. or another member of our [employer] Provider Team in [place]: Jenne Campus, MD . Jyl Heinz, MD  Any Other Special Instructions Will Be Listed Below (If Applicable).

## 2019-01-18 ENCOUNTER — Telehealth: Payer: Self-pay | Admitting: *Deleted

## 2019-01-18 LAB — COMPREHENSIVE METABOLIC PANEL
ALT: 14 IU/L (ref 0–32)
AST: 19 IU/L (ref 0–40)
Albumin/Globulin Ratio: 1.8 (ref 1.2–2.2)
Albumin: 4.2 g/dL (ref 3.8–4.9)
Alkaline Phosphatase: 78 IU/L (ref 39–117)
BUN/Creatinine Ratio: 20 (ref 9–23)
BUN: 19 mg/dL (ref 6–24)
Bilirubin Total: 0.5 mg/dL (ref 0.0–1.2)
CO2: 26 mmol/L (ref 20–29)
Calcium: 9.3 mg/dL (ref 8.7–10.2)
Chloride: 100 mmol/L (ref 96–106)
Creatinine, Ser: 0.97 mg/dL (ref 0.57–1.00)
GFR calc Af Amer: 77 mL/min/{1.73_m2} (ref >59)
GFR calc non Af Amer: 67 mL/min/{1.73_m2} (ref >59)
Globulin, Total: 2.3 g/dL (ref 1.5–4.5)
Glucose: 91 mg/dL (ref 65–99)
Potassium: 3.9 mmol/L (ref 3.5–5.2)
Sodium: 140 mmol/L (ref 134–144)
Total Protein: 6.5 g/dL (ref 6.0–8.5)

## 2019-01-18 LAB — CBC
Hematocrit: 49.6 % — ABNORMAL HIGH (ref 34.0–46.6)
Hemoglobin: 16.5 g/dL — ABNORMAL HIGH (ref 11.1–15.9)
MCH: 30.9 pg (ref 26.6–33.0)
MCHC: 33.3 g/dL (ref 31.5–35.7)
MCV: 93 fL (ref 79–97)
Platelets: 155 10*3/uL (ref 150–450)
RBC: 5.34 x10E6/uL — ABNORMAL HIGH (ref 3.77–5.28)
RDW: 12.9 % (ref 11.7–15.4)
WBC: 3.8 10*3/uL (ref 3.4–10.8)

## 2019-01-18 LAB — LIPOPROTEIN A (LPA): Lipoprotein (a): 13.2 nmol/L (ref <75.0)

## 2019-01-18 LAB — TSH: TSH: 1.95 u[IU]/mL (ref 0.450–4.500)

## 2019-01-18 LAB — PRO B NATRIURETIC PEPTIDE: NT-Pro BNP: 168 pg/mL (ref 0–249)

## 2019-01-18 NOTE — Telephone Encounter (Signed)
Telephone call to patient. Informed of stable/normal lab results.

## 2019-01-18 NOTE — Telephone Encounter (Signed)
-----   Message from Richardo Priest, MD sent at 01/18/2019  1:56 PM EDT ----- Normal or stable result

## 2019-02-16 ENCOUNTER — Telehealth (HOSPITAL_COMMUNITY): Payer: Self-pay | Admitting: *Deleted

## 2019-02-16 NOTE — Telephone Encounter (Signed)
Follow up ° ° °Patient is returning your call. Please call. ° ° ° °

## 2019-02-16 NOTE — Telephone Encounter (Signed)
Left message on voicemail in reference to upcoming appointment scheduled for 02/22/19. Phone number given for a call back so details instructions can be given. Shelby Huffman Jacqueline   

## 2019-02-21 ENCOUNTER — Telehealth: Payer: Self-pay | Admitting: *Deleted

## 2019-02-21 NOTE — Telephone Encounter (Signed)
Patient given detailed instructions per Myocardial Perfusion Study Information Sheet for the test on 02/22/19 Patient notified to arrive 15 minutes early and that it is imperative to arrive on time for appointment to keep from having the test rescheduled.  If you need to cancel or reschedule your appointment, please call the office within 24 hours of your appointment. . Patient verbalized understanding. Shelby Huffman    

## 2019-02-22 ENCOUNTER — Other Ambulatory Visit: Payer: Self-pay

## 2019-02-22 ENCOUNTER — Ambulatory Visit (INDEPENDENT_AMBULATORY_CARE_PROVIDER_SITE_OTHER): Payer: Medicaid Other

## 2019-02-22 DIAGNOSIS — R079 Chest pain, unspecified: Secondary | ICD-10-CM | POA: Diagnosis not present

## 2019-02-22 MED ORDER — REGADENOSON 0.4 MG/5ML IV SOLN
0.4000 mg | Freq: Once | INTRAVENOUS | Status: AC
  Administered 2019-02-22: 0.4 mg via INTRAVENOUS

## 2019-02-22 MED ORDER — TECHNETIUM TC 99M TETROFOSMIN IV KIT
30.0000 | PACK | Freq: Once | INTRAVENOUS | Status: AC | PRN
  Administered 2019-02-22: 30 via INTRAVENOUS

## 2019-02-22 MED ORDER — AMINOPHYLLINE 25 MG/ML IV SOLN
75.0000 mg | Freq: Once | INTRAVENOUS | Status: AC
  Administered 2019-02-22: 75 mg via INTRAVENOUS

## 2019-02-22 MED ORDER — TECHNETIUM TC 99M TETROFOSMIN IV KIT
9.6000 | PACK | Freq: Once | INTRAVENOUS | Status: AC | PRN
  Administered 2019-02-22: 9.6 via INTRAVENOUS

## 2019-02-23 LAB — MYOCARDIAL PERFUSION IMAGING
LV dias vol: 69 mL (ref 46–106)
LV sys vol: 25 mL
Peak HR: 105 {beats}/min
Rest HR: 60 {beats}/min
SDS: 2
SRS: 4
SSS: 6
TID: 1.32

## 2019-03-02 ENCOUNTER — Ambulatory Visit: Payer: Medicaid Other | Admitting: Family

## 2019-03-02 ENCOUNTER — Telehealth: Payer: Self-pay | Admitting: Family

## 2019-03-02 ENCOUNTER — Encounter: Payer: Self-pay | Admitting: Family

## 2019-03-02 NOTE — Telephone Encounter (Signed)
Left message for Mrs. Leoni to call us back regarding her missed appointment on 03/02/19 at 1:15PM.   Loel Dubonnet, NP

## 2019-05-08 ENCOUNTER — Other Ambulatory Visit: Payer: Self-pay | Admitting: Cardiology

## 2019-05-09 NOTE — Telephone Encounter (Signed)
Pantoprazole refill sent to [facility]  In [place], requires follow-up visit

## 2019-05-31 ENCOUNTER — Ambulatory Visit: Payer: Medicaid Other | Admitting: Cardiology

## 2019-06-11 ENCOUNTER — Other Ambulatory Visit: Payer: Self-pay | Admitting: Cardiology

## 2019-07-04 ENCOUNTER — Other Ambulatory Visit: Payer: Self-pay | Admitting: *Deleted

## 2019-07-04 MED ORDER — PANTOPRAZOLE SODIUM 40 MG PO TBEC: 40.0000 mg | DELAYED_RELEASE_TABLET | Freq: Every day | ORAL | 1 refills | Status: DC

## 2020-06-21 ENCOUNTER — Other Ambulatory Visit: Payer: Self-pay | Admitting: Cardiology

## 2020-12-17 NOTE — Progress Notes (Deleted)
Cardiology Office Note:    Date:  12/17/2020   ID:  Shelby Huffman, DOB [birth year], MRN [id number]  PCP:  Galvin Proffer, MD  Cardiologist:  Norman Herrlich, MD    Referring MD: Galvin Proffer, MD    ASSESSMENT:    No diagnosis found. PLAN:    In order of problems listed above:  ***   Next appointment: ***   Medication Adjustments/Labs and Tests Ordered: Current medicines are reviewed at length with the patient today.  Concerns regarding medicines are outlined above.  No orders of the defined types were placed in this encounter.  No orders of the defined types were placed in this encounter.   No chief complaint on file.   History of Present Illness:    Shelby Huffman is a 55 y.o. female with a hx of CAD with PCI of the LAD December 2017 hypertension and hyper lipidemia last seen 01/17/2019 with exertional shortness of breath.  Additional medical problems include generalized anxiety disorder probably osteoarthritis rheumatoid arthritis chronic low back pain.  Coronary angiography December 2017 showed severe two-vessel CAD normal ejection fraction with PCI and stent to the mid and distal LAD and 60 to 70% mid to distal right coronary artery stenosis.. Compliance with diet, lifestyle and medications: ***  She had a gated Myoview study performed 02/22/2019 showing EF of 64% and no evidence of ischemia no ischemic EKG changes normal blood pressure response and no chest pain. Past Medical History:  Diagnosis Date   Abnormal perimenopausal bleeding 01/09/2015   Allergic rhinitis    Anxiety    Coronary artery disease involving native coronary artery of native heart with angina pectoris (HCC) 06/03/2016   (a) 05/2016 DES to LAD and residual 60-70% stenosis in RCA   Dyslipidemia 05/17/2016   Dyspnea, unspecified    Environmental allergies    Essential hypertension 05/17/2016   GAD (generalized anxiety disorder) 04/15/2016   GERD (gastroesophageal reflux disease)    Hyperlipidemia     Hypokalemia 05/17/2016   Low back pain    Lumbago with sciatica, left side    Neuropathy    Polyosteoarthritis    Precordial pain 05/17/2016   Rheumatoid arthritis with rheumatoid factor (HCC)    SOB (shortness of breath) 05/17/2016   Tobacco abuse 05/17/2016    Past Surgical History:  Procedure Laterality Date   bilateral tubal ligation     CARDIAC CATHETERIZATION     Stent placed    Current Medications: No outpatient medications have been marked as taking for the 12/18/20 encounter (Appointment) with Baldo Daub, MD.     Allergies:   Atorvastatin, Duloxetine, Sulfamethazine, Penicillins, Morphine, and Sulfa antibiotics   Social History   Socioeconomic History   Marital status: Married    Spouse name: Not on file   Number of children: Not on file   Years of education: Not on file   Highest education level: Not on file  Occupational History   Not on file  Tobacco Use   Smoking status: Every Day    Packs/day: 1.00    Pack years: 0.00    Types: Cigarettes   Smokeless tobacco: Never  Substance and Sexual Activity   Alcohol use: No   Drug use: No   Sexual activity: Never    Birth control/protection: Surgical  Other Topics Concern   Not on file  Social History Narrative   Not on file   Social Determinants of Health   Financial Resource Strain: Not on file  Food Insecurity: Not  on file  Transportation Needs: Not on file  Physical Activity: Not on file  Stress: Not on file  Social Connections: Not on file     Family History: The patient's ***family history includes Breast cancer in her mother; Heart disease in her father; Hypertension in her father and mother; Kidney disease in her father. ROS:   Please see the history of present illness.    All other systems reviewed and are negative.  EKGs/Labs/Other Studies Reviewed:    The following studies were reviewed today:  EKG:  EKG ordered today and personally reviewed.  The ekg ordered today demonstrates  ***  Recent Labs: No results found for requested labs within last 8760 hours.  Recent Lipid Panel No results found for: CHOL, TRIG, HDL, CHOLHDL, VLDL, LDLCALC, LDLDIRECT  Physical Exam:    VS:  There were no vitals taken for this visit.    Wt Readings from Last 3 Encounters:  02/22/19 184 lb (83.5 kg)  01/17/19 184 lb (83.5 kg)  01/09/15 179 lb 3.2 oz (81.3 kg)     GEN: *** Well nourished, well developed in no acute distress HEENT: Normal NECK: No JVD; No carotid bruits LYMPHATICS: No lymphadenopathy CARDIAC: ***RRR, no murmurs, rubs, gallops RESPIRATORY:  Clear to auscultation without rales, wheezing or rhonchi  ABDOMEN: Soft, non-tender, non-distended MUSCULOSKELETAL:  No edema; No deformity  SKIN: Warm and dry NEUROLOGIC:  Alert and oriented x 3 PSYCHIATRIC:  Normal affect    Signed, Norman Herrlich, MD  12/17/2020 12:39 PM    [facility]

## 2020-12-18 ENCOUNTER — Ambulatory Visit: Payer: Medicaid Other | Admitting: Cardiology

## 2021-09-01 DIAGNOSIS — I2510 Atherosclerotic heart disease of native coronary artery without angina pectoris: Secondary | ICD-10-CM | POA: Diagnosis not present

## 2021-09-01 DIAGNOSIS — I10 Essential (primary) hypertension: Secondary | ICD-10-CM | POA: Diagnosis not present

## 2021-09-01 DIAGNOSIS — Z720 Tobacco use: Secondary | ICD-10-CM

## 2021-09-01 DIAGNOSIS — R079 Chest pain, unspecified: Secondary | ICD-10-CM | POA: Diagnosis not present

## 2021-09-01 DIAGNOSIS — E785 Hyperlipidemia, unspecified: Secondary | ICD-10-CM | POA: Diagnosis not present

## 2021-09-01 DIAGNOSIS — R011 Cardiac murmur, unspecified: Secondary | ICD-10-CM | POA: Diagnosis not present

## 2021-09-01 NOTE — H&P (Signed)
No data in chart concerning symptoms was admitted for cath. ?

## 2021-09-02 ENCOUNTER — Inpatient Hospital Stay (HOSPITAL_COMMUNITY)
Admission: AD | Admit: 2021-09-02 | Discharge: 2021-09-04 | DRG: 247 | Disposition: A | Discharge status: Home / Self Care | Payer: Medicaid Other | Attending: Interventional Cardiology | Admitting: Interventional Cardiology

## 2021-09-02 ENCOUNTER — Encounter (HOSPITAL_COMMUNITY)
Admission: AD | Disposition: A | Discharge status: Home / Self Care | Payer: Self-pay | Source: Home / Self Care | Attending: Interventional Cardiology

## 2021-09-02 ENCOUNTER — Encounter (HOSPITAL_COMMUNITY): Payer: Self-pay | Admitting: Interventional Cardiology

## 2021-09-02 DIAGNOSIS — J309 Allergic rhinitis, unspecified: Secondary | ICD-10-CM | POA: Diagnosis present

## 2021-09-02 DIAGNOSIS — F411 Generalized anxiety disorder: Secondary | ICD-10-CM | POA: Diagnosis present

## 2021-09-02 DIAGNOSIS — G8929 Other chronic pain: Secondary | ICD-10-CM | POA: Diagnosis present

## 2021-09-02 DIAGNOSIS — Z8249 Family history of ischemic heart disease and other diseases of the circulatory system: Secondary | ICD-10-CM | POA: Diagnosis not present

## 2021-09-02 DIAGNOSIS — Z9851 Tubal ligation status: Secondary | ICD-10-CM

## 2021-09-02 DIAGNOSIS — Z882 Allergy status to sulfonamides status: Secondary | ICD-10-CM

## 2021-09-02 DIAGNOSIS — Z955 Presence of coronary angioplasty implant and graft: Secondary | ICD-10-CM | POA: Diagnosis not present

## 2021-09-02 DIAGNOSIS — Z79899 Other long term (current) drug therapy: Secondary | ICD-10-CM | POA: Diagnosis not present

## 2021-09-02 DIAGNOSIS — M5442 Lumbago with sciatica, left side: Secondary | ICD-10-CM | POA: Diagnosis present

## 2021-09-02 DIAGNOSIS — K219 Gastro-esophageal reflux disease without esophagitis: Secondary | ICD-10-CM | POA: Diagnosis present

## 2021-09-02 DIAGNOSIS — Z880 Allergy status to penicillin: Secondary | ICD-10-CM | POA: Diagnosis not present

## 2021-09-02 DIAGNOSIS — E785 Hyperlipidemia, unspecified: Secondary | ICD-10-CM | POA: Diagnosis present

## 2021-09-02 DIAGNOSIS — I200 Unstable angina: Secondary | ICD-10-CM

## 2021-09-02 DIAGNOSIS — Z7982 Long term (current) use of aspirin: Secondary | ICD-10-CM | POA: Diagnosis not present

## 2021-09-02 DIAGNOSIS — M059 Rheumatoid arthritis with rheumatoid factor, unspecified: Secondary | ICD-10-CM | POA: Diagnosis present

## 2021-09-02 DIAGNOSIS — Z888 Allergy status to other drugs, medicaments and biological substances status: Secondary | ICD-10-CM | POA: Diagnosis not present

## 2021-09-02 DIAGNOSIS — M0580 Other rheumatoid arthritis with rheumatoid factor of unspecified site: Secondary | ICD-10-CM | POA: Diagnosis present

## 2021-09-02 DIAGNOSIS — D696 Thrombocytopenia, unspecified: Secondary | ICD-10-CM | POA: Diagnosis not present

## 2021-09-02 DIAGNOSIS — F17210 Nicotine dependence, cigarettes, uncomplicated: Secondary | ICD-10-CM | POA: Diagnosis present

## 2021-09-02 DIAGNOSIS — I25110 Atherosclerotic heart disease of native coronary artery with unstable angina pectoris: Principal | ICD-10-CM | POA: Diagnosis present

## 2021-09-02 DIAGNOSIS — Z885 Allergy status to narcotic agent status: Secondary | ICD-10-CM

## 2021-09-02 DIAGNOSIS — R079 Chest pain, unspecified: Secondary | ICD-10-CM | POA: Diagnosis not present

## 2021-09-02 DIAGNOSIS — N289 Disorder of kidney and ureter, unspecified: Secondary | ICD-10-CM | POA: Diagnosis not present

## 2021-09-02 DIAGNOSIS — R0602 Shortness of breath: Secondary | ICD-10-CM | POA: Diagnosis not present

## 2021-09-02 DIAGNOSIS — I25119 Atherosclerotic heart disease of native coronary artery with unspecified angina pectoris: Secondary | ICD-10-CM

## 2021-09-02 DIAGNOSIS — Z720 Tobacco use: Secondary | ICD-10-CM | POA: Diagnosis not present

## 2021-09-02 DIAGNOSIS — I10 Essential (primary) hypertension: Secondary | ICD-10-CM | POA: Diagnosis present

## 2021-09-02 HISTORY — PX: INTRAVASCULAR PRESSURE WIRE/FFR STUDY: CATH118243

## 2021-09-02 HISTORY — PX: LEFT HEART CATH AND CORONARY ANGIOGRAPHY: CATH118249

## 2021-09-02 HISTORY — DX: Unstable angina: I20.0

## 2021-09-02 LAB — POCT ACTIVATED CLOTTING TIME
Activated Clotting Time: 0 seconds
Activated Clotting Time: 323 seconds

## 2021-09-02 SURGERY — LEFT HEART CATH AND CORONARY ANGIOGRAPHY
Anesthesia: LOCAL

## 2021-09-02 MED ORDER — LIDOCAINE HCL (PF) 1 % IJ SOLN
INTRAMUSCULAR | Status: DC | PRN
Start: 2021-09-02 — End: 2021-09-02
  Administered 2021-09-02: 2 mL

## 2021-09-02 MED ORDER — ASPIRIN 81 MG PO CHEW
81.0000 mg | CHEWABLE_TABLET | ORAL | Status: AC
  Administered 2021-09-03: 81 mg via ORAL
  Filled 2021-09-02: qty 1

## 2021-09-02 MED ORDER — IOHEXOL 350 MG/ML SOLN
INTRAVENOUS | Status: DC | PRN
  Administered 2021-09-02: 130 mL

## 2021-09-02 MED ORDER — CLOPIDOGREL BISULFATE 75 MG PO TABS
150.0000 mg | ORAL_TABLET | Freq: Once | ORAL | Status: AC
  Administered 2021-09-02: 150 mg via ORAL

## 2021-09-02 MED ORDER — ONDANSETRON HCL 4 MG/2ML IJ SOLN: 4.0000 mg | Freq: Four times a day (QID) | INTRAMUSCULAR | Status: DC | PRN

## 2021-09-02 MED ORDER — SODIUM CHLORIDE 0.9 % IV SOLN: INTRAVENOUS | Status: AC

## 2021-09-02 MED ORDER — HYDROCHLOROTHIAZIDE 25 MG PO TABS
25.0000 mg | ORAL_TABLET | Freq: Every day | ORAL | Status: DC
Start: 2021-09-03 — End: 2021-09-03

## 2021-09-02 MED ORDER — METOPROLOL SUCCINATE ER 25 MG PO TB24
25.0000 mg | ORAL_TABLET | Freq: Every day | ORAL | Status: DC
  Administered 2021-09-02 – 2021-09-04 (×3): 25 mg via ORAL
  Filled 2021-09-02 (×3): qty 1

## 2021-09-02 MED ORDER — FENTANYL CITRATE (PF) 100 MCG/2ML IJ SOLN
INTRAMUSCULAR | Status: AC
  Filled 2021-09-02: qty 2

## 2021-09-02 MED ORDER — HEPARIN (PORCINE) IN NACL 1000-0.9 UT/500ML-% IV SOLN
INTRAVENOUS | Status: DC | PRN
  Administered 2021-09-02 (×2): 500 mL

## 2021-09-02 MED ORDER — CLOPIDOGREL BISULFATE 75 MG PO TABS
75.0000 mg | ORAL_TABLET | Freq: Every day | ORAL | Status: DC
  Administered 2021-09-03 – 2021-09-04 (×2): 75 mg via ORAL
  Filled 2021-09-02 (×2): qty 1

## 2021-09-02 MED ORDER — ASPIRIN 81 MG PO CHEW: 81.0000 mg | CHEWABLE_TABLET | Freq: Every day | ORAL | Status: DC

## 2021-09-02 MED ORDER — FENTANYL CITRATE (PF) 100 MCG/2ML IJ SOLN
INTRAMUSCULAR | Status: DC | PRN
  Administered 2021-09-02: 50 ug via INTRAVENOUS
  Administered 2021-09-02: 25 ug via INTRAVENOUS

## 2021-09-02 MED ORDER — MIDAZOLAM HCL 2 MG/2ML IJ SOLN
INTRAMUSCULAR | Status: AC
  Filled 2021-09-02: qty 2

## 2021-09-02 MED ORDER — LABETALOL HCL 5 MG/ML IV SOLN: 10.0000 mg | INTRAVENOUS | Status: AC | PRN

## 2021-09-02 MED ORDER — ALBUTEROL SULFATE HFA 108 (90 BASE) MCG/ACT IN AERS: 2.0000 | INHALATION_SPRAY | Freq: Four times a day (QID) | RESPIRATORY_TRACT | Status: DC

## 2021-09-02 MED ORDER — HEPARIN SODIUM (PORCINE) 1000 UNIT/ML IJ SOLN
INTRAMUSCULAR | Status: AC
  Filled 2021-09-02: qty 10

## 2021-09-02 MED ORDER — SODIUM CHLORIDE 0.9% FLUSH
3.0000 mL | Freq: Two times a day (BID) | INTRAVENOUS | Status: DC
  Administered 2021-09-02: 3 mL via INTRAVENOUS

## 2021-09-02 MED ORDER — HYDRALAZINE HCL 20 MG/ML IJ SOLN: 10.0000 mg | INTRAMUSCULAR | Status: AC | PRN

## 2021-09-02 MED ORDER — LORATADINE 10 MG PO TABS
10.0000 mg | ORAL_TABLET | Freq: Every day | ORAL | Status: DC
  Administered 2021-09-02 – 2021-09-04 (×3): 10 mg via ORAL
  Filled 2021-09-02 (×3): qty 1

## 2021-09-02 MED ORDER — POTASSIUM CHLORIDE CRYS ER 20 MEQ PO TBCR
20.0000 meq | EXTENDED_RELEASE_TABLET | Freq: Every day | ORAL | Status: DC
  Administered 2021-09-02: 20 meq via ORAL
  Filled 2021-09-02: qty 1

## 2021-09-02 MED ORDER — SODIUM CHLORIDE 0.9 % WEIGHT BASED INFUSION
1.0000 mL/kg/h | INTRAVENOUS | Status: DC
  Administered 2021-09-02: 1 mL/kg/h via INTRAVENOUS

## 2021-09-02 MED ORDER — HEPARIN (PORCINE) IN NACL 1000-0.9 UT/500ML-% IV SOLN
INTRAVENOUS | Status: AC
  Filled 2021-09-02: qty 1000

## 2021-09-02 MED ORDER — HEPARIN (PORCINE) 25000 UT/250ML-% IV SOLN
1000.0000 [IU]/h | INTRAVENOUS | Status: DC
  Administered 2021-09-02: 1000 [IU]/h via INTRAVENOUS
  Filled 2021-09-02: qty 250

## 2021-09-02 MED ORDER — LIDOCAINE HCL (PF) 1 % IJ SOLN
INTRAMUSCULAR | Status: AC
  Filled 2021-09-02: qty 30

## 2021-09-02 MED ORDER — HEPARIN SODIUM (PORCINE) 1000 UNIT/ML IJ SOLN
INTRAMUSCULAR | Status: DC | PRN
  Administered 2021-09-02: 5000 [IU] via INTRAVENOUS
  Administered 2021-09-02: 3500 [IU] via INTRAVENOUS

## 2021-09-02 MED ORDER — NITROGLYCERIN IN D5W 200-5 MCG/ML-% IV SOLN
0.0000 ug/min | INTRAVENOUS | Status: DC
  Administered 2021-09-02: 20 ug/min via INTRAVENOUS

## 2021-09-02 MED ORDER — NITROGLYCERIN IN D5W 200-5 MCG/ML-% IV SOLN
INTRAVENOUS | Status: AC | PRN
  Administered 2021-09-02: 10 ug/min via INTRAVENOUS

## 2021-09-02 MED ORDER — NITROGLYCERIN 1 MG/10 ML FOR IR/CATH LAB
INTRA_ARTERIAL | Status: AC
  Filled 2021-09-02: qty 10

## 2021-09-02 MED ORDER — NITROGLYCERIN IN D5W 200-5 MCG/ML-% IV SOLN
INTRAVENOUS | Status: AC
  Filled 2021-09-02: qty 250

## 2021-09-02 MED ORDER — MIDAZOLAM HCL 2 MG/2ML IJ SOLN
INTRAMUSCULAR | Status: DC | PRN
  Administered 2021-09-02: 1 mg via INTRAVENOUS

## 2021-09-02 MED ORDER — CLOPIDOGREL BISULFATE 75 MG PO TABS
ORAL_TABLET | ORAL | Status: AC
  Filled 2021-09-02: qty 2

## 2021-09-02 MED ORDER — NITROGLYCERIN 1 MG/10 ML FOR IR/CATH LAB
INTRA_ARTERIAL | Status: DC | PRN
  Administered 2021-09-02: 200 ug via INTRACORONARY

## 2021-09-02 MED ORDER — SODIUM CHLORIDE 0.9 % IV SOLN
INTRAVENOUS | Status: AC | PRN
  Administered 2021-09-02: 10 mL/h via INTRAVENOUS

## 2021-09-02 MED ORDER — OXYCODONE HCL 5 MG PO TABS
5.0000 mg | ORAL_TABLET | ORAL | Status: DC | PRN
  Administered 2021-09-02 – 2021-09-03 (×4): 10 mg via ORAL
  Filled 2021-09-02 (×5): qty 2

## 2021-09-02 MED ORDER — ACETAMINOPHEN 325 MG PO TABS
650.0000 mg | ORAL_TABLET | ORAL | Status: DC | PRN
  Administered 2021-09-03 – 2021-09-04 (×2): 650 mg via ORAL
  Filled 2021-09-02 (×4): qty 2

## 2021-09-02 MED ORDER — VERAPAMIL HCL 2.5 MG/ML IV SOLN
INTRAVENOUS | Status: DC | PRN
  Administered 2021-09-02: 10 mL via INTRA_ARTERIAL

## 2021-09-02 MED ORDER — SODIUM CHLORIDE 0.9 % IV SOLN: 250.0000 mL | INTRAVENOUS | Status: DC | PRN

## 2021-09-02 MED ORDER — CLOPIDOGREL BISULFATE 75 MG PO TABS: 75.0000 mg | ORAL_TABLET | Freq: Every day | ORAL | Status: DC

## 2021-09-02 MED ORDER — SODIUM CHLORIDE 0.9% FLUSH: 3.0000 mL | INTRAVENOUS | Status: DC | PRN

## 2021-09-02 MED ORDER — EZETIMIBE 10 MG PO TABS
10.0000 mg | ORAL_TABLET | Freq: Every day | ORAL | Status: DC
  Administered 2021-09-02 – 2021-09-04 (×3): 10 mg via ORAL
  Filled 2021-09-02 (×3): qty 1

## 2021-09-02 MED ORDER — ASPIRIN EC 81 MG PO TBEC
81.0000 mg | DELAYED_RELEASE_TABLET | Freq: Every day | ORAL | Status: DC
  Administered 2021-09-04: 81 mg via ORAL
  Filled 2021-09-02: qty 1

## 2021-09-02 MED ORDER — VERAPAMIL HCL 2.5 MG/ML IV SOLN
INTRAVENOUS | Status: AC
  Filled 2021-09-02: qty 2

## 2021-09-02 SURGICAL SUPPLY — 14 items
CATH INFINITI 5 FR JL3.5 (CATHETERS) ×1 IMPLANT
CATH INFINITI JR4 5F (CATHETERS) ×1 IMPLANT
CATH LAUNCHER 5F EBU3.5 (CATHETERS) ×1 IMPLANT
DEVICE RAD COMP TR BAND LRG (VASCULAR PRODUCTS) ×1 IMPLANT
ELECT DEFIB PAD ADLT CADENCE (PAD) ×1 IMPLANT
GLIDESHEATH SLEND A-KIT 6F 22G (SHEATH) ×1 IMPLANT
GUIDEWIRE INQWIRE 1.5J.035X260 (WIRE) IMPLANT
GUIDEWIRE PRESSURE X 175 (WIRE) ×1 IMPLANT
INQWIRE 1.5J .035X260CM (WIRE) ×2
KIT HEART LEFT (KITS) ×2 IMPLANT
PACK CARDIAC CATHETERIZATION (CUSTOM PROCEDURE TRAY) ×2 IMPLANT
SHEATH PROBE COVER 6X72 (BAG) ×1 IMPLANT
TRANSDUCER W/STOPCOCK (MISCELLANEOUS) ×2 IMPLANT
TUBING CIL FLEX 10 FLL-RA (TUBING) ×2 IMPLANT

## 2021-09-02 NOTE — Progress Notes (Signed)
Report and care received from Vernona Rieger, [place]. Patient resting comfortably at this time. Reports no pain. Will continue to monitor patient.  ?

## 2021-09-02 NOTE — Progress Notes (Addendum)
Patient arrived via carelink. ?Patient in hemodynamically stable condition with 6/10 chest pain.  ?Consent signed. ?IV fluids started. ?Heparin running. ?MD notified.  ?

## 2021-09-02 NOTE — Progress Notes (Signed)
Report and care given to Laura,RN. Patient resting comfortably at this time. VSS.  ?

## 2021-09-02 NOTE — Interval H&P Note (Signed)
Cath Lab Visit (complete for each Cath Lab visit) ? ?Clinical Evaluation Leading to the Procedure:  ? ?ACS: Yes.   ? ?Non-ACS:   ? ?Anginal Classification: CCS IV ? ?Anti-ischemic medical therapy: Minimal Therapy (1 class of medications) ? ?Non-Invasive Test Results: No non-invasive testing performed ? ?Prior CABG: No previous CABG ? ? ? ? ? ?History and Physical Interval Note: ? ?09/02/2021 ?2:11 PM ? ?Shelby Huffman  has presented today for surgery, with the diagnosis of abnormal stress test.  The various methods of treatment have been discussed with the patient and family. After consideration of risks, benefits and other options for treatment, the patient has consented to  Procedure(s): ?LEFT HEART CATH AND CORONARY ANGIOGRAPHY (N/A) as a surgical intervention.  The patient's history has been reviewed, patient examined, no change in status, stable for surgery.  I have reviewed the patient's chart and labs.  Questions were answered to the patient's satisfaction.   ? ? ?Belva Crome III ? ? ?

## 2021-09-02 NOTE — Progress Notes (Signed)
ANTICOAGULATION CONSULT NOTE - Initial Consult ? ?Pharmacy Consult for Heparin ?Indication:  single vessel CAD - plan for PCI 3/22 ? ?Allergies  ?Allergen Reactions  ? Atorvastatin Other (See Comments)  ? Duloxetine Other (See Comments)  ? Sulfamethazine Other (See Comments)  ?  TOLD BY MOTHER SHE IS ALLERGIC  ? Penicillins Hives  ? Morphine Nausea Only  ? Sulfa Antibiotics Other (See Comments)  ?  Was told by mother that she was allergic.  ? ? ?Patient Measurements: ?Height: 5\' 7"  (170.2 cm) ?Weight: 72.6 kg (160 lb) ?IBW/kg (Calculated) : 61.6 ?Heparin Dosing Weight: 72 kg ? ?Vital Signs: ?BP: 123/74 (03/21 1640) ?Pulse Rate: 67 (03/21 1640) ? ?Labs: ?No results for input(s): HGB, HCT, PLT, APTT, LABPROT, INR, HEPARINUNFRC, HEPRLOWMOCWT, CREATININE, CKTOTAL, CKMB, TROPONINIHS in the last 72 hours. ? ?CrCl cannot be calculated (Patient's most recent lab result is older than the maximum 21 days allowed.). ? ? ?Medical History: ?Past Medical History:  ?Diagnosis Date  ? Abnormal perimenopausal bleeding 01/09/2015  ? Allergic rhinitis   ? Anxiety   ? Coronary artery disease involving native coronary artery of native heart with angina pectoris (HCC) 06/03/2016  ? (a) 05/2016 DES to LAD and residual 60-70% stenosis in RCA  ? Dyslipidemia 05/17/2016  ? Dyspnea, unspecified   ? Environmental allergies   ? Essential hypertension 05/17/2016  ? GAD (generalized anxiety disorder) 04/15/2016  ? GERD (gastroesophageal reflux disease)   ? Hyperlipidemia   ? Hypokalemia 05/17/2016  ? Low back pain   ? Lumbago with sciatica, left side   ? Neuropathy   ? Polyosteoarthritis   ? Precordial pain 05/17/2016  ? Rheumatoid arthritis with rheumatoid factor (HCC)   ? SOB (shortness of breath) 05/17/2016  ? Tobacco abuse 05/17/2016  ? ? ?Medications:  ?Awaiting home med rec ? ?Assessment: ?56 y.o. F presents from [facility] with CP. Found to have RCA occlusion - plan for PCI 3/22. To begin heparin 8 hours post sheath removal -sheath was removed at  1530. No AC PTA.  ?Labs at OSH (3/21 am): Hgb 15.8, plt 124, SCr 1 ?Heparin 1000 units/hr at OSH with 2 therapeutic levels on this rate ? ?Goal of Therapy:  ?Heparin level 0.3-0.7 units/ml ?Monitor platelets by anticoagulation protocol: Yes ?  ?Plan:  ?At 2330, start heparin gtt at 1000 units/hr. No bolus. ?Will f/u heparin level 8 hours post start ?F/u post PCI tomorrow for further labs ? ?2331, PharmD, BCPS ?Please see amion for complete clinical pharmacist phone list ?09/02/2021,5:10 PM ? ? ?

## 2021-09-02 NOTE — H&P (Addendum)
The patient has been seen in conjunction with Robet Leu, PAC. All aspects of care have been considered and discussed. The patient has been personally interviewed, examined, and all clinical data has been reviewed.  Suspicious ischemic symptoms without objective EKG or enzyme evidence of injury.  Prior history of proximal and more distal LAD stenting in 2017.  Current symptoms are similar to prior symptoms before stenting. Multiple risk factors including smoking, dyslipidemia, rheumatoid arthritis,    Cardiology Admission History and Physical:   Patient ID: Shelby Huffman MRN: [id number]; DOB: August 15, 1965   Admission date: (Not on file)  PCP:  Hortencia Conradi, NP   [facility] Providers Cardiologist:  Norman Herrlich, MD      Chief Complaint:  Chest pain  Patient Profile:   Shelby Huffman is a 56 y.o. female with CAD s/p stent x2 to the LAD 2017, HTN, HLD, tobacco use, chronic back pain, sinus bradycardia who is being seen 09/02/2021 for the evaluation of exertional chest pain.  History of Present Illness:   Shelby Huffman is a 56 year old female with above medical history who was last seen by Dr. Dulce Sellar in 01/2019. Per chart review, patient underwent cardiac catheterizatino in 05/2016 showing severe 2 vessel CAD requiring DES to proximal to mid and mid to distal LAD. There was moderate to severe 60-70% mid to distal RCA disease. Noted to be intolerant to Brilinta, and was transitioned to Plavix.   Patient was last seen by Dr. Dulce Sellar in 01/2019. At that time, patient has SOB, anxiety, and chest discomfort when emotionally stressed. Underwent a nuclear stress test on 9/9.2020 that was a low risk study without evidence of ischemia, LVEF was normal (64%).   Patient presented to the [facility] ED on 08/31/21 complaining of chest pain on exertion. Per chart, patient had been having chest pain since 3/18. Described chest tightness, dull pain, radiating to both shoulders and  occasionally her back. Associated with sweating on minimal exertion. Reports that she has symptoms when getting up to use the bathroom, and the symptoms relieved with rest. Patient had several episodes on 3/18 and awoke on 3/19 with chest pain at rest. Denied any fevers, cough, abdominal pain, nausea, vomiting, diarrhea, numbness, tingling, headache. Stated that the last time she had these symptoms was 5 years ago when she had stents placed to the LAD.   Patient presented with a BP of 169/86, HR 53, oxygen saturation 92% on room air. Labs showed WBC 3.6, hemoglobin 15.9, platelets 138, BUN 29, creatinine 1.00. CXR showed no active cardiopulmonary disease. Initial troponin less than 0.01.   Patient was stent for stress test, but was having active chest pain so the test was canceled. Cardiology recommended transfer to [facility].   On interview, patient reports having intermittent chest pain for the past 2 days. Started on 3/18. Worse with exertion. Described as tightness and heat that radiated to both of her shoulders. Associated with sweating. Denies SOB, palpitations, dizziness/lightheadedness.   Past Medical History:  Diagnosis Date   Abnormal perimenopausal bleeding 01/09/2015   Allergic rhinitis    Anxiety    Coronary artery disease involving native coronary artery of native heart with angina pectoris (HCC) 06/03/2016   (a) 05/2016 DES to LAD and residual 60-70% stenosis in RCA   Dyslipidemia 05/17/2016   Dyspnea, unspecified    Environmental allergies    Essential hypertension 05/17/2016   GAD (generalized anxiety disorder) 04/15/2016   GERD (gastroesophageal reflux disease)  Hyperlipidemia    Hypokalemia 05/17/2016   Low back pain    Lumbago with sciatica, left side    Neuropathy    Polyosteoarthritis    Precordial pain 05/17/2016   Rheumatoid arthritis with rheumatoid factor (HCC)    SOB (shortness of breath) 05/17/2016   Tobacco abuse 05/17/2016    Past Surgical  History:  Procedure Laterality Date   bilateral tubal ligation     CARDIAC CATHETERIZATION     Stent placed     Medications Prior to Admission: Prior to Admission medications   Medication Sig Start Date End Date Taking? Authorizing Provider  albuterol (VENTOLIN HFA) 108 (90 Base) MCG/ACT inhaler Inhale into the lungs. 05/08/16   [provider]  ASPIRIN LOW DOSE 81 MG EC tablet Take 81 mg by mouth daily. 12/11/18   [provider]  clopidogrel (PLAVIX) 75 MG tablet Take 75 mg by mouth daily. 12/11/18   [provider]  ezetimibe (ZETIA) 10 MG tablet Take 10 mg by mouth daily. 01/04/19   [provider]  hydrochlorothiazide (HYDRODIURIL) 25 MG tablet Take 25 mg by mouth daily. 12/16/14   [provider]  KLOR-CON M20 20 MEQ tablet Take 20 mEq by mouth daily. with food 01/03/19   [provider]  loratadine (CLARITIN) 10 MG tablet Take 10 mg by mouth daily. 01/02/15   [provider]  medroxyPROGESTERone (PROVERA) 10 MG tablet Take 1 tablet (10 mg total) by mouth daily. Use for ten days if 6-8 weeks since last period 01/09/15   Adam Phenix, MD  metoprolol succinate (TOPROL-XL) 25 MG 24 hr tablet TAKE 1 TABLET BY MOUTH EVERY DAY 06/23/16   [provider]  metoprolol tartrate (LOPRESSOR) 25 MG tablet Take 25 mg by mouth daily. 12/06/14   [provider]  nitroGLYCERIN (NITROSTAT) 0.4 MG SL tablet Place 1 tablet (0.4 mg total) under the tongue every 5 (five) minutes as needed for chest pain. 01/17/19 04/17/19  Baldo Daub, MD  oxyCODONE (ROXICODONE) 15 MG immediate release tablet Take 15 mg by mouth every 8 (eight) hours as needed. 01/06/19   [provider]  pantoprazole (PROTONIX) 40 MG tablet Take 1 tablet (40 mg total) by mouth daily. 07/04/19 08/03/19  Baldo Daub, MD  pravastatin (PRAVACHOL) 20 MG tablet Take 20 mg by mouth at bedtime. 11/16/14   [provider]  Vitamin D, Ergocalciferol, (DRISDOL)  1.25 MG (50000 UT) CAPS capsule Take 50,000 Units by mouth once a week. 01/08/19   [provider]     Allergies:    Allergies  Allergen Reactions   Atorvastatin Other (See Comments)   Duloxetine Other (See Comments)   Sulfamethazine Other (See Comments)    TOLD BY MOTHER SHE IS ALLERGIC   Penicillins Hives   Morphine Nausea Only   Sulfa Antibiotics Other (See Comments)    Was told by mother that she was allergic.    Social History:   Social History   Socioeconomic History   Marital status: Married    Spouse name: Not on file   Number of children: Not on file   Years of education: Not on file   Highest education level: Not on file  Occupational History   Not on file  Tobacco Use   Smoking status: Every Day    Packs/day: 1.00    Types: Cigarettes   Smokeless tobacco: Never  Substance and Sexual Activity   Alcohol use: No   Drug use: No   Sexual activity:  Never    Birth control/protection: Surgical  Other Topics Concern   Not on file  Social History Narrative   Not on file   Social Determinants of Health   Financial Resource Strain: Not on file  Food Insecurity: Not on file  Transportation Needs: Not on file  Physical Activity: Not on file  Stress: Not on file  Social Connections: Not on file  Intimate Partner Violence: Not on file    Family History:   The patient's family history includes Breast cancer in her mother; Heart disease in her father; Hypertension in her father and mother; Kidney disease in her father.    ROS:  Please see the history of present illness.  All other ROS reviewed and negative.     Physical Exam/Data:  There were no vitals filed for this visit. No intake or output data in the 24 hours ending 09/02/21 1208 Last 3 Weights 02/22/2019 01/17/2019 01/09/2015  Weight (lbs) 184 lb 184 lb 179 lb 3.2 oz  Weight (kg) 83.462 kg 83.462 kg 81.285 kg     There is no height or weight on file to calculate BMI.  General:  Well nourished, well  developed, in no acute distress HEENT: normal Neck: no JVD Vascular: Radial pulses 2+ bilaterally   Cardiac:  normal S1, S2; RRR; no murmur  Lungs:  clear to auscultation bilaterally, no wheezing, rhonchi or rales  Abd: soft, nontender, no hepatomegaly  Ext: no edema Musculoskeletal:  No deformities, BUE and BLE strength normal and equal Skin: warm and dry  Neuro:  CNs 2-12 intact, no focal abnormalities noted Psych:  Normal affect    EKG:  The ECG that was done 08/31/21 was personally reviewed and demonstrates sinus bradycardia   Relevant CV Studies:   Laboratory Data:  High Sensitivity Troponin:  No results for input(s): TROPONINIHS in the last 720 hours.    ChemistryNo results for input(s): NA, K, CL, CO2, GLUCOSE, BUN, CREATININE, CALCIUM, MG, GFRNONAA, GFRAA, ANIONGAP in the last 168 hours.  No results for input(s): PROT, ALBUMIN, AST, ALT, ALKPHOS, BILITOT in the last 168 hours. Lipids No results for input(s): CHOL, TRIG, HDL, LABVLDL, LDLCALC, CHOLHDL in the last 168 hours. HematologyNo results for input(s): WBC, RBC, HGB, HCT, MCV, MCH, MCHC, RDW, PLT in the last 168 hours. Thyroid No results for input(s): TSH, FREET4 in the last 168 hours. BNPNo results for input(s): BNP, PROBNP in the last 168 hours.  DDimer No results for input(s): DDIMER in the last 168 hours.   Radiology/Studies:  No results found.   Assessment and Plan:   Chest Pain  CAD s/p stenting x2 to the LAD  - EKG showed sinus bradycardia. Troponin negative at [facility]  - Patient was set to have a stress test at [facility], but had active chest pain so the test was canceled. Cardiology recommended transfer to [facility] for [facility].  - Patient is now scheduled to undergo LHC. Scheduled for this afternoon. Has been NPO since midnight    - Continue ASA, plavix  - Continue metoprolol succinate  - Continue Zetia   HTN  - Continue hydrochlorothiazide 25 mg daily  - Continue metoprolol succinate 50 mg  daily   HLD - Continue home medications     Risk Assessment/Risk Scores:   HEAR Score (for undifferentiated chest pain):  HEAR Score: 5{      Severity of Illness: The appropriate patient status for this patient is OBSERVATION. Observation status is judged to be reasonable and necessary in  order to provide the required intensity of service to ensure the patient's safety. The patient's presenting symptoms, physical exam findings, and initial radiographic and laboratory data in the context of their medical condition is felt to place them at decreased risk for further clinical deterioration. Furthermore, it is anticipated that the patient will be medically stable for discharge from the hospital within 2 midnights of admission.    For questions or updates, please contact [facility] Please consult [email or web address] for contact info under     Signed, Jonita Albee, PA-C  09/02/2021 12:08 PM

## 2021-09-03 ENCOUNTER — Encounter (HOSPITAL_COMMUNITY): Payer: Self-pay | Admitting: Cardiovascular Disease

## 2021-09-03 ENCOUNTER — Encounter (HOSPITAL_COMMUNITY)
Admission: AD | Disposition: A | Discharge status: Home / Self Care | Payer: Self-pay | Source: Home / Self Care | Attending: Interventional Cardiology

## 2021-09-03 ENCOUNTER — Inpatient Hospital Stay (HOSPITAL_COMMUNITY): Payer: Medicaid Other

## 2021-09-03 DIAGNOSIS — R079 Chest pain, unspecified: Secondary | ICD-10-CM

## 2021-09-03 DIAGNOSIS — I25110 Atherosclerotic heart disease of native coronary artery with unstable angina pectoris: Principal | ICD-10-CM

## 2021-09-03 DIAGNOSIS — E785 Hyperlipidemia, unspecified: Secondary | ICD-10-CM

## 2021-09-03 HISTORY — PX: CORONARY STENT INTERVENTION: CATH118234

## 2021-09-03 LAB — BASIC METABOLIC PANEL
Anion gap: 9 (ref 5–15)
BUN: 18 mg/dL (ref 6–20)
CO2: 25 mmol/L (ref 22–32)
Calcium: 8.6 mg/dL — ABNORMAL LOW (ref 8.9–10.3)
Chloride: 106 mmol/L (ref 98–111)
Creatinine, Ser: 1.16 mg/dL — ABNORMAL HIGH (ref 0.44–1.00)
GFR, Estimated: 55 mL/min — ABNORMAL LOW (ref >60)
Glucose, Bld: 96 mg/dL (ref 70–99)
Potassium: 4.6 mmol/L (ref 3.5–5.1)
Sodium: 140 mmol/L (ref 135–145)

## 2021-09-03 LAB — CBC
HCT: 42.2 % (ref 36.0–46.0)
Hemoglobin: 14.6 g/dL (ref 12.0–15.0)
MCH: 31 pg (ref 26.0–34.0)
MCHC: 34.6 g/dL (ref 30.0–36.0)
MCV: 89.6 fL (ref 80.0–100.0)
Platelets: 117 10*3/uL — ABNORMAL LOW (ref 150–400)
RBC: 4.71 MIL/uL (ref 3.87–5.11)
RDW: 12.4 % (ref 11.5–15.5)
WBC: 4.8 10*3/uL (ref 4.0–10.5)
nRBC: 0 % (ref 0.0–0.2)

## 2021-09-03 LAB — ECHOCARDIOGRAM COMPLETE
AR max vel: 2.5 cm2
AV Area VTI: 2.48 cm2
AV Area mean vel: 2.4 cm2
AV Mean grad: 3 mmHg
AV Peak grad: 5.6 mmHg
Ao pk vel: 1.18 m/s
Area-P 1/2: 2.29 cm2
Calc EF: 56.9 %
Height: 67 in
S' Lateral: 3 cm
Single Plane A2C EF: 55.8 %
Single Plane A4C EF: 60.9 %
Weight: 2560 oz

## 2021-09-03 LAB — TROPONIN I (HIGH SENSITIVITY)
Troponin I (High Sensitivity): 75 ng/L — ABNORMAL HIGH (ref <18)
Troponin I (High Sensitivity): 99 ng/L — ABNORMAL HIGH (ref <18)

## 2021-09-03 LAB — POCT ACTIVATED CLOTTING TIME: Activated Clotting Time: 281 seconds

## 2021-09-03 LAB — HEPARIN LEVEL (UNFRACTIONATED): Heparin Unfractionated: 0.39 IU/mL (ref 0.30–0.70)

## 2021-09-03 SURGERY — CORONARY STENT INTERVENTION
Anesthesia: LOCAL

## 2021-09-03 MED ORDER — SODIUM CHLORIDE 0.9% FLUSH: 3.0000 mL | Freq: Two times a day (BID) | INTRAVENOUS | Status: DC

## 2021-09-03 MED ORDER — IOHEXOL 350 MG/ML SOLN
INTRAVENOUS | Status: DC | PRN
  Administered 2021-09-03: 110 mL

## 2021-09-03 MED ORDER — SODIUM CHLORIDE 0.9% FLUSH
3.0000 mL | INTRAVENOUS | Status: DC | PRN
Start: 2021-09-03 — End: 2021-09-04

## 2021-09-03 MED ORDER — LIDOCAINE HCL (PF) 1 % IJ SOLN
INTRAMUSCULAR | Status: DC | PRN
  Administered 2021-09-03: 2 mL

## 2021-09-03 MED ORDER — FENTANYL CITRATE (PF) 100 MCG/2ML IJ SOLN
INTRAMUSCULAR | Status: AC
  Filled 2021-09-03: qty 2

## 2021-09-03 MED ORDER — CLOPIDOGREL BISULFATE 300 MG PO TABS
ORAL_TABLET | ORAL | Status: AC
  Filled 2021-09-03: qty 1

## 2021-09-03 MED ORDER — HEPARIN (PORCINE) IN NACL 1000-0.9 UT/500ML-% IV SOLN
INTRAVENOUS | Status: AC
  Filled 2021-09-03: qty 500

## 2021-09-03 MED ORDER — BIVALIRUDIN BOLUS VIA INFUSION - CUPID
INTRAVENOUS | Status: AC | PRN
  Administered 2021-09-03: .1 mg/kg/min via INTRAVENOUS

## 2021-09-03 MED ORDER — VERAPAMIL HCL 2.5 MG/ML IV SOLN
INTRAVENOUS | Status: AC
  Filled 2021-09-03: qty 2

## 2021-09-03 MED ORDER — NITROGLYCERIN 1 MG/10 ML FOR IR/CATH LAB
INTRA_ARTERIAL | Status: AC
  Filled 2021-09-03: qty 10

## 2021-09-03 MED ORDER — LIDOCAINE HCL (PF) 1 % IJ SOLN
INTRAMUSCULAR | Status: AC
  Filled 2021-09-03: qty 30

## 2021-09-03 MED ORDER — VERAPAMIL HCL 2.5 MG/ML IV SOLN
INTRAVENOUS | Status: DC | PRN
  Administered 2021-09-03: 10 mL via INTRA_ARTERIAL

## 2021-09-03 MED ORDER — SODIUM CHLORIDE 0.9 % WEIGHT BASED INFUSION
1.0000 mL/kg/h | INTRAVENOUS | Status: AC
  Administered 2021-09-03: 1 mL/kg/h via INTRAVENOUS

## 2021-09-03 MED ORDER — MIDAZOLAM HCL 2 MG/2ML IJ SOLN
INTRAMUSCULAR | Status: AC
  Filled 2021-09-03: qty 2

## 2021-09-03 MED ORDER — NITROGLYCERIN 1 MG/10 ML FOR IR/CATH LAB
INTRA_ARTERIAL | Status: DC | PRN
  Administered 2021-09-03 (×2): 200 ug via INTRACORONARY

## 2021-09-03 MED ORDER — HEPARIN SODIUM (PORCINE) 1000 UNIT/ML IJ SOLN
INTRAMUSCULAR | Status: AC
  Filled 2021-09-03: qty 10

## 2021-09-03 MED ORDER — FENTANYL CITRATE (PF) 100 MCG/2ML IJ SOLN
INTRAMUSCULAR | Status: DC | PRN
Start: 2021-09-03 — End: 2021-09-03
  Administered 2021-09-03 (×2): 25 ug via INTRAVENOUS

## 2021-09-03 MED ORDER — PRAVASTATIN SODIUM 10 MG PO TABS
20.0000 mg | ORAL_TABLET | Freq: Every day | ORAL | Status: DC
  Filled 2021-09-03: qty 2

## 2021-09-03 MED ORDER — ACETAMINOPHEN 500 MG PO TABS
1000.0000 mg | ORAL_TABLET | Freq: Once | ORAL | Status: AC
  Administered 2021-09-03: 1000 mg via ORAL
  Filled 2021-09-03: qty 2

## 2021-09-03 MED ORDER — CLOPIDOGREL BISULFATE 300 MG PO TABS
ORAL_TABLET | ORAL | Status: DC | PRN
Start: 2021-09-03 — End: 2021-09-03
  Administered 2021-09-03: 300 mg via ORAL

## 2021-09-03 MED ORDER — HEPARIN (PORCINE) IN NACL 1000-0.9 UT/500ML-% IV SOLN
INTRAVENOUS | Status: DC | PRN
  Administered 2021-09-03 (×2): 500 mL

## 2021-09-03 MED ORDER — ISOSORBIDE MONONITRATE ER 30 MG PO TB24
30.0000 mg | ORAL_TABLET | Freq: Every day | ORAL | Status: DC
Start: 2021-09-03 — End: 2021-09-04
  Administered 2021-09-03 – 2021-09-04 (×2): 30 mg via ORAL
  Filled 2021-09-03 (×2): qty 1

## 2021-09-03 MED ORDER — MIDAZOLAM HCL 2 MG/2ML IJ SOLN
INTRAMUSCULAR | Status: DC | PRN
  Administered 2021-09-03 (×2): 1 mg via INTRAVENOUS

## 2021-09-03 MED ORDER — SODIUM CHLORIDE 0.9 % IV SOLN: 250.0000 mL | INTRAVENOUS | Status: DC | PRN

## 2021-09-03 MED ORDER — SODIUM CHLORIDE 0.9 % IV SOLN
INTRAVENOUS | Status: DC | PRN
  Administered 2021-09-03: .25 mg/kg/h via INTRAVENOUS

## 2021-09-03 SURGICAL SUPPLY — 20 items
BALLN SAPPHIRE 2.75X15 (BALLOONS) ×2
BALLN TREK OTW 2.5X12 (BALLOONS) ×2
BALLN ~~LOC~~ EUPHORA RX 3.5X15 (BALLOONS) ×2
BALLOON SAPPHIRE 2.75X15 (BALLOONS) IMPLANT
BALLOON TREK OTW 2.5X12 (BALLOONS) IMPLANT
BALLOON ~~LOC~~ EUPHORA RX 3.5X15 (BALLOONS) IMPLANT
CATH LAUNCHER 6FR AL.75 (CATHETERS) ×1 IMPLANT
DEVICE RAD COMP TR BAND LRG (VASCULAR PRODUCTS) ×1 IMPLANT
ELECT DEFIB PAD ADLT CADENCE (PAD) ×1 IMPLANT
GLIDESHEATH SLEND SS 6F .021 (SHEATH) ×2 IMPLANT
GUIDEWIRE INQWIRE 1.5J.035X260 (WIRE) IMPLANT
INQWIRE 1.5J .035X260CM (WIRE) ×2
KIT ENCORE 26 ADVANTAGE (KITS) ×2 IMPLANT
KIT HEART LEFT (KITS) ×2 IMPLANT
PACK CARDIAC CATHETERIZATION (CUSTOM PROCEDURE TRAY) ×2 IMPLANT
STENT SYNERGY XD 3.0X24 (Permanent Stent) IMPLANT
SYNERGY XD 3.0X24 (Permanent Stent) ×2 IMPLANT
TRANSDUCER W/STOPCOCK (MISCELLANEOUS) ×2 IMPLANT
TUBING CIL FLEX 10 FLL-RA (TUBING) ×2 IMPLANT
WIRE RUNTHROUGH .014X300CM (WIRE) ×1 IMPLANT

## 2021-09-03 NOTE — Progress Notes (Signed)
Following the cardiac catheterization yesterday, I had a long conversation with the patient and her daughter in individual conversations.  The patient's daughter was not at the hospital. ?Following the procedure yesterday, the interventional team engage in conversation concerning her right coronary anatomy and engaged in recommendations concerning management. ?The decision was made that bypass surgery for the right coronary would not be appropriate given her young age and the relative distribution of the right coronary.  Hemodynamics in the left coronary system demonstrated there was no significant stenosis beyond the restenotic segment.  RFR was 0.93. ?Anatomic features of the right coronary increase the risk of ischemic complications and there is a possibility of acute occlusion related to attempts to cross the lesion which is beyond multiple bends and angulations and in a very distal location.  Because of this I discussed with the patient the higher risk, perhaps in the 10 to 15% range.  On the other hand if the wire crosses easily, the procedure may become quite simple to do.  Should she have total occlusion that we are unable to remedy, discretion will need to be made about whether to consider emergency surgery or not.  In my opinion, medical management would be more appropriate. ?Patient understands the risks involved.  She is thankful that we have potential treatment options.  She looks forward to the procedure which is scheduled to be done tomorrow by Dr. Muhammad Arida. ?

## 2021-09-03 NOTE — Progress Notes (Signed)
[email or web address] of chest pain  & back pain.EKG done  &  MD on call Dr.Ye was made aware & reorder Nitroglycerin drip . ?

## 2021-09-03 NOTE — Progress Notes (Signed)
R radial TR band removed. Gauze and tegaderm placed. Site is dry, tender, and a level 1 due to hematoma/bruising from yesterday's heart cath, but no changes after today's cath.  ?

## 2021-09-03 NOTE — Progress Notes (Addendum)
HR noted to be 48-49 on telemetry at times, most often while sleeping. Otherwise 50s-70s while awake. Telemetry does not show any significant bradycardia, pauses, or arrhythmias. Family member notified nurse of HR 49 on one occasion and patient felt hot. BP 129/71. No CP or SOB. Reviewed with Dr. Acie Fredrickson who recently checked on pt and she was stable; he also provided emotional reassurance as well as this as been stressful for the patient. He recommends continued rest and monitoring without additional intervention at this time. Will put hold parameters for AM BB dose. hsTroponin resulted at 75-99, consistent with known clinical presentation. ?

## 2021-09-03 NOTE — H&P (View-Only) (Signed)
Following the cardiac catheterization yesterday, I had a long conversation with the patient and her daughter in individual conversations.  The patient's daughter was not at the hospital. ?Following the procedure yesterday, the interventional team engage in conversation concerning her right coronary anatomy and engaged in recommendations concerning management. ?The decision was made that bypass surgery for the right coronary would not be appropriate given her young age and the relative distribution of the right coronary.  Hemodynamics in the left coronary system demonstrated there was no significant stenosis beyond the restenotic segment.  RFR was 0.93. ?Anatomic features of the right coronary increase the risk of ischemic complications and there is a possibility of acute occlusion related to attempts to cross the lesion which is beyond multiple bends and angulations and in a very distal location.  Because of this I discussed with the patient the higher risk, perhaps in the 10 to 15% range.  On the other hand if the wire crosses easily, the procedure may become quite simple to do.  Should she have total occlusion that we are unable to remedy, discretion will need to be made about whether to consider emergency surgery or not.  In my opinion, medical management would be more appropriate. ?Patient understands the risks involved.  She is thankful that we have potential treatment options.  She looks forward to the procedure which is scheduled to be done tomorrow by Dr. Kathlyn [place]. ?

## 2021-09-03 NOTE — Plan of Care (Signed)
  Problem: Cardiovascular: Goal: Ability to achieve and maintain adequate cardiovascular perfusion will improve Outcome: Progressing   

## 2021-09-03 NOTE — Progress Notes (Signed)
Mobility Specialist Progress Note  ? ? 09/03/21 1722  ?Mobility  ?Activity Ambulated independently in hallway  ?Level of Assistance Standby assist, set-up cues, supervision of patient - no hands on  ?Assistive Device None  ?Distance Ambulated (ft) 200 ft  ?Activity Response Tolerated fair  ?$Mobility charge 1 Mobility  ? ?During Mobility: 72 HR ? ?Pt received in bed and agreeable. C/o nausea and R shoulder pain. Returned to bed with call bell in reach.  ? ?Shelby Huffman ?Mobility Specialist  ?  ?

## 2021-09-03 NOTE — Progress Notes (Signed)
? ?  Echocardiogram ?2D Echocardiogram has been performed. ? ?Shelby Huffman ?09/03/2021, 2:08 PM ?

## 2021-09-03 NOTE — Progress Notes (Signed)
Noted bruised ,swelling & painful to touch on left forearm.MD on call Dr.Ye made aware & came to see pt.& ordered cold compress  ?

## 2021-09-03 NOTE — Progress Notes (Addendum)
? ?Progress Note ? ?Patient Name: Shelby Huffman ?Date of Encounter: 09/03/2021 ? ?Primary Cardiologist: Shirlee More, MD ? ?Subjective  ? ?Called by nurse for 10/10 chest pain. ?With NTG gtt drip, decreased to 6/10 but patient remains tearful and says she does not want to die. ?Got oxycodone 10mg  one hour ago. Reports allergy to morphine. ? ?Inpatient Medications  ?  ?Scheduled Meds: ? [START ON 09/04/2021] aspirin EC  81 mg Oral Daily  ? clopidogrel  75 mg Oral Q breakfast  ? ezetimibe  10 mg Oral Daily  ? loratadine  10 mg Oral Daily  ? metoprolol succinate  25 mg Oral Daily  ? sodium chloride flush  3 mL Intravenous Q12H  ? sodium chloride flush  3 mL Intravenous Q12H  ? ?Continuous Infusions: ? sodium chloride    ? sodium chloride    ? sodium chloride 1 mL/kg/hr (09/02/21 2018)  ? heparin 1,000 Units/hr (09/02/21 2326)  ? nitroGLYCERIN 30 mcg/min (09/03/21 0909)  ? ?PRN Meds: ?sodium chloride, sodium chloride, acetaminophen, ondansetron (ZOFRAN) IV, oxyCODONE, sodium chloride flush, sodium chloride flush  ? ?Vital Signs  ?  ?Vitals:  ? 09/03/21 0854 09/03/21 0859 09/03/21 0904 09/03/21 0909  ?BP: (!) 165/100 (!) 164/88 (!) 162/81 (!) 154/88  ?Pulse:      ?Resp: (!) 23 17 (!) 21 (!) 23  ?Temp:      ?TempSrc:      ?SpO2:      ?Weight:      ?Height:      ? ? ?Intake/Output Summary (Last 24 hours) at 09/03/2021 0912 ?Last data filed at 09/02/2021 2348 ?Gross per 24 hour  ?Intake --  ?Output 200 ml  ?Net -200 ml  ? ? ?  09/02/2021  ? 12:00 PM 02/22/2019  ? 10:57 AM 01/17/2019  ?  9:49 AM  ?Last 3 Weights  ?Weight (lbs) 160 lb 184 lb 184 lb  ?Weight (kg) 72.576 kg 83.462 kg 83.462 kg  ?  ? ?Telemetry  ?  ?NSR - Personally Reviewed ? ?ECG  ?  ?NSR nonspecific STTW changes similar to prior - Personally Reviewed ? ?Physical Exam  ? ?GEN: No acute distress.  ?HEENT: Normocephalic, atraumatic, sclera non-icteric. ?Neck: No JVD or bruits. ?Cardiac: RRR no murmurs, rubs, or gallops.  ?Respiratory: Clear to auscultation  bilaterally. Breathing is unlabored. ?GI: Soft, nontender, non-distended, BS +x 4. ?MS: no deformity. ?Extremities: No clubbing or cyanosis. No edema. Distal pedal pulses are 2+ and equal bilaterally. ?Neuro:  AAOx3. Follows commands. ?Psych:  Responds to questions appropriately with a tearful affect. ? ?Labs  ?  ?High Sensitivity Troponin:  No results for input(s): TROPONINIHS in the last 720 hours.   ? ?Cardiac EnzymesNo results for input(s): TROPONINI in the last 168 hours. No results for input(s): TROPIPOC in the last 168 hours.  ? ?Chemistry ?Recent Labs  ?Lab 09/03/21 ?[id number]  ?NA 140  ?K 4.6  ?CL 106  ?CO2 25  ?GLUCOSE 96  ?BUN 18  ?CREATININE 1.16*  ?CALCIUM 8.6*  ?GFRNONAA 55*  ?ANIONGAP 9  ?  ? ?Hematology ?Recent Labs  ?Lab 09/03/21 ?[id number]  ?WBC 4.8  ?RBC 4.71  ?HGB 14.6  ?HCT 42.2  ?MCV 89.6  ?MCH 31.0  ?MCHC 34.6  ?RDW 12.4  ?PLT 117*  ? ? ?BNPNo results for input(s): BNP, PROBNP in the last 168 hours.  ? ?DDimer No results for input(s): DDIMER in the last 168 hours.  ? ?Radiology  ?  ?CARDIAC CATHETERIZATION ? ?Result Date: 09/02/2021 ?CONCLUSIONS: Moderate  diffuse in-stent restenosis in the proximal LAD stent obstructing the vessel by up to 60%.  RFR 0.93. Widely patent left main Widely patent large circumflex Right coronary is the culprit.  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  Competitive flow in the PDA suggesting LAD septal perforator collaterals are present. Inferobasal hypokinesis.  Mid anterior wall hypokinesis.  EF 50%.  LVEDP 22 mmHg. RECOMMENDATIONS: Single-vessel coronary bypass versus high risk PCI with option for emergency surgery if vessel closure occurs.  There would also be option to treat the right coronary medically rather than with emergency surgery if the patient is tolerating occlusion in a stable manner. Plan continue dual antiplatelet therapy that the patient has been on leading to this hospital stay. Discussed with  other members of the interventional team.  Have already discussed with [name] and [name].   ? ?Cardiac Studies  ? ?Cath 09/02/21 ?CONCLUSIONS: ?Moderate diffuse in-stent restenosis in the proximal LAD stent obstructing the vessel by up to 60%.  RFR 0.93. ?Widely patent left main ?Widely patent large circumflex ?Right coronary is the culprit.  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  Competitive flow in the PDA suggesting LAD septal perforator collaterals are present. ?Inferobasal hypokinesis.  Mid anterior wall hypokinesis.  EF 50%.  LVEDP 22 mmHg. ?RECOMMENDATIONS: ?  ?Single-vessel coronary bypass versus high risk PCI with option for emergency surgery if vessel closure occurs.  There would also be option to treat the right coronary medically rather than with emergency surgery if the patient is tolerating occlusion in a stable manner. ?Plan continue dual antiplatelet therapy that the patient has been on leading to this hospital stay. ?Discussed with other members of the interventional team.  Have already discussed with [name] and [name]. ? ?Patient Profile  ?   ?56 y.o. female with CAD s/p stent to LAD 2017 with residual RCA disease, HTN, HLD, chronic back pain, sinus bradycardia presented initially to [facility] with episodic chest pain. Troponins 0.01-0.02-0.02.Transferred to [facility].  ? ?Assessment & Plan  ?  ?1. 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 Thompson Caul review with interventional colleagues, plan PCI today ?- given recurrent severe chest pain have notified cath lab to expedite case - will go next ?- continue ASA (received pre-cath dose this AM), Plavix,  metoprolol, NTG gtt, Zetia ?- previously intolerant to atorvastatin, no longer on pravastatin either (last OV 2020) ?- check lipid profile in AM and consider trial of alternative statin versus PCSK9i if LDL >55 ?- will also update troponins to assess from initial values ?- obtain echocardiogram ? ?2. Essential HTN ?- BP soft this AM at 108/73 but increased in setting of pain today ?- due to renal insufficiency/cath will hold HCTZ/KCl today and continue to titrate NTG gtt for chest pain/HTN ?- consider choice of alternative BP control to optimize antianginal therapy I.e. amlodipine if needed ? ?3. Hyperlipidemia ?- lipid plan as above ? ?4. Sinus bradycardia ?- stable, HR 50s-60s on low dose Toprol ? ?5. Mild renal insufficiency ?- admit Cr 1.00, today Cr 1.16, K 4.6 - CrCl 6ml/min ?- hold HCTZ/KCl today and follow ? ?6. Thrombocytopenia ?-  admit plt reported to be decreased at 138 (prior to administration heparin), down to 117 today ?- per d/w MD, follow clinically for now ?- f/u CBC/CMET in AM ? ? ?For questions or updates, please contact [place] ?Please consult [email or web address] for contact info under Cardiology/STEMI. ? ?Signed, ?Charlie Pitter, PA-C ?09/03/2021, 9:12 AM   ? ?Attending Note:  ? ?The patient was seen and examined.  Agree with assessment and plan as noted above.  Changes made to the above note as needed. ? ?Patient seen and independently examined with Melina Copa, PA .   We discussed all aspects of the encounter. I agree with the assessment and plan as stated above.  ? ? ACS:  pt has had significant CP this am.   Cath lab has been called and she will be moved up in the schedule .   We have discussed the risks, benefits, options of cath and PCI.   She understands and agrees to proceed. ? ?2. HTN:   cont meds ? ?3.  HLD:   intol to several statins.  Would benefit from lipid clinic consultation  ? ? I have spent a total of 40 minutes with patient reviewing hospital  notes , telemetry, EKGs, labs and  examining patient as well as establishing an assessment and plan that was discussed with the patient.  > 50% of time was spent in direct patient care. ? ? ? ?Thayer Headings, Brooke Bonito., MD, [facility] ?09/03/2021, 12:17 PM ?112

## 2021-09-03 NOTE — Progress Notes (Signed)
Mobility Specialist Progress Note  ? ? 09/03/21 1554  ?Mobility  ?Activity Contraindicated/medical hold  ? ?RN advised to hold off as pt lethargic.  ? ?Burgin Nation ?Mobility Specialist  ?  ?

## 2021-09-03 NOTE — Interval H&P Note (Signed)
History and Physical Interval Note: ? ?The patient had a prolonged episode of chest pain this morning with no significant EKG changes.  Given her unstable symptoms, we decided to expedite the procedure.Cath Lab Visit (complete for each Cath Lab visit) ? ?Clinical Evaluation Leading to the Procedure:  ? ?ACS: Yes.   ? ?Non-ACS:  n/a ? ? ? ? ? ?09/03/2021 ?9:39 AM ? ?Shelby Huffman  has presented today for surgery, with the diagnosis of cad.  The various methods of treatment have been discussed with the patient and family. After consideration of risks, benefits and other options for treatment, the patient has consented to  Procedure(s): ?CORONARY STENT INTERVENTION (N/A) as a surgical intervention.  The patient's history has been reviewed, patient examined, no change in status, stable for surgery.  I have reviewed the patient's chart and labs.  Questions were answered to the patient's satisfaction.   ? ? ?Lorine Bears ? ? ?

## 2021-09-04 ENCOUNTER — Other Ambulatory Visit (HOSPITAL_COMMUNITY): Payer: Self-pay

## 2021-09-04 LAB — LIPID PANEL
Cholesterol: 181 mg/dL (ref 0–200)
HDL: 35 mg/dL — ABNORMAL LOW (ref >40)
LDL Cholesterol: 131 mg/dL — ABNORMAL HIGH (ref 0–99)
Total CHOL/HDL Ratio: 5.2 RATIO
Triglycerides: 76 mg/dL (ref <150)
VLDL: 15 mg/dL (ref 0–40)

## 2021-09-04 LAB — COMPREHENSIVE METABOLIC PANEL
ALT: 15 U/L (ref 0–44)
AST: 21 U/L (ref 15–41)
Albumin: 3.3 g/dL — ABNORMAL LOW (ref 3.5–5.0)
Alkaline Phosphatase: 48 U/L (ref 38–126)
Anion gap: 7 (ref 5–15)
BUN: 12 mg/dL (ref 6–20)
CO2: 23 mmol/L (ref 22–32)
Calcium: 8.8 mg/dL — ABNORMAL LOW (ref 8.9–10.3)
Chloride: 106 mmol/L (ref 98–111)
Creatinine, Ser: 0.87 mg/dL (ref 0.44–1.00)
GFR, Estimated: >60 mL/min (ref >60)
Glucose, Bld: 92 mg/dL (ref 70–99)
Potassium: 3.8 mmol/L (ref 3.5–5.1)
Sodium: 136 mmol/L (ref 135–145)
Total Bilirubin: 1.1 mg/dL (ref 0.3–1.2)
Total Protein: 5.9 g/dL — ABNORMAL LOW (ref 6.5–8.1)

## 2021-09-04 LAB — CBC
HCT: 41.6 % (ref 36.0–46.0)
Hemoglobin: 14.5 g/dL (ref 12.0–15.0)
MCH: 30.5 pg (ref 26.0–34.0)
MCHC: 34.9 g/dL (ref 30.0–36.0)
MCV: 87.6 fL (ref 80.0–100.0)
Platelets: 110 10*3/uL — ABNORMAL LOW (ref 150–400)
RBC: 4.75 MIL/uL (ref 3.87–5.11)
RDW: 12.3 % (ref 11.5–15.5)
WBC: 5.1 10*3/uL (ref 4.0–10.5)
nRBC: 0 % (ref 0.0–0.2)

## 2021-09-04 MED ORDER — NICOTINE 21 MG/24HR TD PT24
21.0000 mg | MEDICATED_PATCH | Freq: Every day | TRANSDERMAL | Status: DC
  Administered 2021-09-04: 21 mg via TRANSDERMAL
  Filled 2021-09-04: qty 1

## 2021-09-04 MED ORDER — ISOSORBIDE MONONITRATE ER 30 MG PO TB24
30.0000 mg | ORAL_TABLET | Freq: Every day | ORAL | 3 refills | Status: DC
Start: 2021-09-05 — End: 2021-09-16
  Filled 2021-09-04: qty 90, 90d supply, fill #0

## 2021-09-04 MED ORDER — NICOTINE 21 MG/24HR TD PT24
21.0000 mg | MEDICATED_PATCH | Freq: Every day | TRANSDERMAL | 0 refills | Status: DC
  Filled 2021-09-04: qty 28, 28d supply, fill #0

## 2021-09-04 NOTE — Progress Notes (Signed)
CARDIAC REHAB PHASE I  ? ?Pt walked yesterday and up in room today. Discussed stent, Plavix, restrictions, smoking cessation, diet, exercise, NTG, and CRPII. Pt very receptive. Wants to quit smoking with nicotine patches. Discussed methods and resources. Will refer to [place].  ?[id number] ? ?Yves Dill CES, ACSM ?09/04/2021 ?11:07 AM ? ? ? ? ?

## 2021-09-04 NOTE — Discharge Summary (Addendum)
?Discharge Summary  ?  ?Patient ID: Shelby Huffman ?MRN: [id number]; DOB: April 24, 1966 ? ?Admit date: 09/02/2021 ?Discharge date: 09/04/2021 ? ?PCP:  Zoila Shutter, NP ?  ?[facility] Providers ?Cardiologist:  Shirlee More, MD   ? ? ?Discharge Diagnoses  ?  ?Principal Problem: ?  Coronary artery disease involving native coronary artery of native heart with angina pectoris ([place]) ?Active Problems: ?  Dyslipidemia ?  Essential hypertension ?  SOB (shortness of breath) ?  Tobacco abuse ?  Rheumatoid arthritis with rheumatoid factor (HCC) ?  Unstable angina (HCC) ? ? ? ?Diagnostic Studies/Procedures  ?  ?Left Heart Cath 09/02/21 ? ?CONCLUSIONS: ?Moderate diffuse in-stent restenosis in the proximal LAD stent obstructing the vessel by up to 60%.  RFR 0.93. ?Widely patent left main ?Widely patent large circumflex ?Right coronary is the culprit.  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  Competitive flow in the PDA suggesting LAD septal perforator collaterals are present. ?Inferobasal hypokinesis.  Mid anterior wall hypokinesis.  EF 50%.  LVEDP 22 mmHg. ?RECOMMENDATIONS: ?  ?Single-vessel coronary bypass versus high risk PCI with option for emergency surgery if vessel closure occurs.  There would also be option to treat the right coronary medically rather than with emergency surgery if the patient is tolerating occlusion in a stable manner. ?Plan continue dual antiplatelet therapy that the patient has been on leading to this hospital stay. ?Discussed with other members of the interventional team.  Have already discussed with [name] and [name]. ?Diagnostic ?Dominance: Right ? ? ?Coronary Stent Intervention  ?  Dist RCA-1 lesion is 99% stenosed. ?  Non-stenotic Dist RCA-2 lesion. ?  A drug-eluting stent was successfully placed using a SYNERGY XD 3.0X24. ?  Post intervention, there is a 0% residual stenosis. ?  ?Successful angioplasty and drug-eluting stent placement  to the distal right coronary artery.  Difficult procedure overall due to severe tortuosity. ?  ?Recommendations: ?Continue long-term dual antiplatelet therapy as tolerated. ?Aggressive treatment of risk factors. ?If LDL is not at target, consider treatment with a PCSK9 inhibitor given poor tolerance to potent statins. ?Diagnostic ?Dominance: Right ?Intervention ? ? ? ? ?Echocardiogram 09/03/21 ? 1. Left ventricular ejection fraction, by estimation, is 55 to 60%. The  ?left ventricle has normal function. Left ventricular endocardial border  ?not optimally defined to evaluate regional wall motion. Left ventricular  ?diastolic parameters were normal.  ? 2. Right ventricular systolic function is normal. The right ventricular  ?size is normal. Tricuspid regurgitation signal is inadequate for assessing  ?PA pressure.  ? 3. The mitral valve is grossly normal. No evidence of mitral valve  ?regurgitation. No evidence of mitral stenosis.  ? 4. The aortic valve is tricuspid. Aortic valve regurgitation is trivial.  ?No aortic stenosis is present.  ? 5. The inferior vena cava is normal in size with greater than 50%  ?respiratory variability, suggesting right atrial pressure of 3 mmHg.  ? ? ?History of Present Illness   ?  ?Shelby Huffman is a 56 y.o. female with  CAD s/p stent to LAD 2017 with residual RCA disease, HTN, HLD, chronic back pain, sinus bradycardia presented initially to [facility] with episodic chest pain. Troponins 0.01-0.02-0.02.[facility].  ? ?Per chart review, patient underwent cardiac catheterization in 05/2016 that showed severe 2 vessel CAD requiring DES to proximal to mid and mid to distal LAD. There was moderate to severe 60-70% mid to distal RCA disease. Noted to be  intolerant to Brilinta, and was transitioned to Plavix.  ?  ?Patient was last seen by Dr. Bettina Gavia in 01/2019. At that time, patient has SOB, anxiety, and chest discomfort when emotionally stressed. Underwent a  nuclear stress test on 02/22/2019 that was a low risk study without evidence of ischemia, LVEF was normal (64%).  ?  ?Patient presented to the [facility] ED on 08/31/21 complaining of chest pain on exertion. Per chart, patient had been having chest pain since 3/18. Described chest tightness, dull pain, radiating to both shoulders and occasionally her back. Associated with sweating on minimal exertion. Reports that she has symptoms when getting up to use the bathroom, and the symptoms relieved with rest. Patient had several episodes on 3/18 and awoke on 3/19 with chest pain at rest. Denied any fevers, cough, abdominal pain, nausea, vomiting, diarrhea, numbness, tingling, headache. Stated that the last time she had these symptoms was 5 years ago when she had stents placed to the LAD.  ?  ?Patient presented with a BP of 169/86, HR 53, oxygen saturation 92% on room air. Labs showed WBC 3.6, hemoglobin 15.9, platelets 138, BUN 29, creatinine 1.00. CXR showed no active cardiopulmonary disease. Initial troponin less than 0.01.  ? ?Patient was stent for stress test, but was having active chest pain so the test was canceled. Cardiology recommended transfer to [facility] for [facility].  ?  ?On initial interview at [facility], patient reported having intermittent chest pain for the past 2 days. Started on 3/18. Worse with exertion. Described as tightness and heat that radiated to both of her shoulders. Associated with sweating. Denies SOB, palpitations, dizziness/lightheadedness.  ? ?Hospital Course  ?   ?Consultants: none  ? ?1. 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  ?She is doing well following PCI of her distal right coronary artery  stenosis.  She is feeling quite a bit better.  No further episodes of chest pain. ?Continue aspirin, Plavix for 1 year. ?Dr. Acie Fredrickson had a long talk with patient about cigarette smoking and also improving her diet. Prescribed  21 mg nicotine patches at discharge.  ?  ?She will follow-up with Dr. Bettina Gavia or an APP in the next several weeks. I have messaged the [facility] schedulers to arrange.  ?  ?  ?2. Essential HTN:  continue isosorbide, metoprolol.  We will titrate up medications as needed in the outpatient setting. BP stable and well controlled at time of discharge  ?  ?  ?3. Hyperlipidemia ?Continue pravastatin and Zetia for now.  She has not tolerated atorvastatin in the past.  She might benefit from a referral to the lipid clinic to be considered for PCSK9 inhibitor, or inclisiran. ?  ?4. Sinus bradycardia ?Stable currently.  Continue metoprolol ?  ?5. Mild renal insufficiency ?Renal function appears stable. Check BMP at follow up visit.  ? ?Did the patient have an acute coronary syndrome (MI, NSTEMI, STEMI, etc) this admission?:  No                               ?Did the patient have a percutaneous coronary intervention (stent / angioplasty)?:  Yes.   ? ? ?Cath/PCI Registry  Performance & Quality Measures: ?Aspirin prescribed? - Yes ?ADP Receptor Inhibitor (Plavix/Clopidogrel, Brilinta/Ticagrelor or Effient/Prasugrel) prescribed (includes medically managed patients)? - Yes ?High Intensity Statin (Lipitor 40-80mg  or Crestor 20-40mg ) prescribed? - No - history of statin intolerance  ?For EF <40%, was ACEI/ARB prescribed? - Not Applicable (EF >/= [id number]) ?For EF <40%, Aldosterone Antagonist (Spironolactone or Eplerenone) prescribed? - Not Applicable (EF >/= [id number]) ?Cardiac Rehab Phase II ordered? - Yes  ? ?   ? ?The patient will be scheduled for a TOC follow up appointment in 2-3 weeks.  A message has been sent to the [facility] and Scheduling Pool at the office where the patient should be seen for follow up.   ? ?Patient was seen and examined by Dr. Acie Fredrickson and deemed stable for discharge.  ?_____________ ? ?Discharge Vitals ?Blood pressure 134/80, pulse 70, temperature 98.2 ?F (36.8 ?C), temperature source Oral, resp. rate 18

## 2021-09-04 NOTE — Progress Notes (Signed)
? ?Progress Note ? ?Patient Name: Shelby Huffman ?Date of Encounter: 09/04/2021 ? ?Primary Cardiologist: Shirlee More, MD ? ?Subjective  ? ? ?S/p PCI yesterday  ?Feeling better today  ?She has ambulated and feels well.  She is ready to go home. ? ? ?Inpatient Medications  ?  ?Scheduled Meds: ? aspirin EC  81 mg Oral Daily  ? clopidogrel  75 mg Oral Q breakfast  ? ezetimibe  10 mg Oral Daily  ? isosorbide mononitrate  30 mg Oral Daily  ? loratadine  10 mg Oral Daily  ? metoprolol succinate  25 mg Oral Daily  ? pravastatin  20 mg Oral QHS  ? sodium chloride flush  3 mL Intravenous Q12H  ? ?Continuous Infusions: ? sodium chloride    ? ?PRN Meds: ?sodium chloride, acetaminophen, ondansetron (ZOFRAN) IV, oxyCODONE, sodium chloride flush  ? ?Vital Signs  ?  ?Vitals:  ? 09/03/21 1640 09/03/21 2029 09/04/21 0549 09/04/21 0831  ?BP: 121/65 111/67 (!) 146/75 134/80  ?Pulse: 70 60 63 70  ?Resp: 13 16 18    ?Temp: 98.5 ?F (36.9 ?C) 98.2 ?F (36.8 ?C) 98.2 ?F (36.8 ?C)   ?TempSrc: Oral Oral Oral   ?SpO2: 97% 94% 94%   ?Weight:      ?Height:      ? ? ?Intake/Output Summary (Last 24 hours) at 09/04/2021 [id number] ?Last data filed at 09/03/2021 1846 ?Gross per 24 hour  ?Intake 634.85 ml  ?Output --  ?Net 634.85 ml  ? ? ? ?  09/02/2021  ? 12:00 PM 02/22/2019  ? 10:57 AM 01/17/2019  ?  9:49 AM  ?Last 3 Weights  ?Weight (lbs) 160 lb 184 lb 184 lb  ?Weight (kg) 72.576 kg 83.462 kg 83.462 kg  ?  ? ?Telemetry  ?  ?NSR - Personally Reviewed ? ?ECG  ?  ?NSR nonspecific STTW changes similar to prior - Personally Reviewed ? ?Physical Exam  ? ?Physical Exam: ?Blood pressure 134/80, pulse 70, temperature 98.2 ?F (36.8 ?C), temperature source Oral, resp. rate 18, height 5\' 7"  (1.702 m), weight 72.6 kg, SpO2 94 %. ? ?GEN:  Well nourished, well developed in no acute distress ?HEENT: Normal ?NECK: No JVD; No carotid bruits ?LYMPHATICS: No lymphadenopathy ?CARDIAC: RRR , no murmurs, rubs, gallops ?RESPIRATORY:  Clear to auscultation without rales, wheezing or  rhonchi  ?ABDOMEN: Soft, non-tender, non-distended ?MUSCULOSKELETAL: Right radial cath site is stable.  Pulses are good.  No edema; No deformity  ?SKIN: Warm and dry ?NEUROLOGIC:  Alert and oriented x 3 ? ? ?Labs  ?  ?High Sensitivity Troponin:   ?Recent Labs  ?Lab 09/03/21 ?1303 09/03/21 ?1500  ?TROPONINIHS 75* 99*  ?   ? ?Cardiac EnzymesNo results for input(s): TROPONINI in the last 168 hours. No results for input(s): TROPIPOC in the last 168 hours.  ? ?Chemistry ?Recent Labs  ?Lab 09/03/21 ?[id number] 09/04/21 ?0732  ?NA 140 136  ?K 4.6 3.8  ?CL 106 106  ?CO2 25 23  ?GLUCOSE 96 92  ?BUN 18 12  ?CREATININE 1.16* 0.87  ?CALCIUM 8.6* 8.8*  ?PROT  --  5.9*  ?ALBUMIN  --  3.3*  ?AST  --  21  ?ALT  --  15  ?ALKPHOS  --  48  ?BILITOT  --  1.1  ?GFRNONAA 55* >60  ?ANIONGAP 9 7  ? ?  ? ?Hematology ?Recent Labs  ?Lab 09/03/21 ?[id number] 09/04/21 ?0732  ?WBC 4.8 5.1  ?RBC 4.71 4.75  ?HGB 14.6 14.5  ?HCT 42.2 41.6  ?MCV 89.6  87.6  ?MCH 31.0 30.5  ?MCHC 34.6 34.9  ?RDW 12.4 12.3  ?PLT 117* 110*  ? ? ? ?BNPNo results for input(s): BNP, PROBNP in the last 168 hours.  ? ?DDimer No results for input(s): DDIMER in the last 168 hours.  ? ?Radiology  ?  ?CARDIAC CATHETERIZATION ? ?Result Date: 09/03/2021 ?  Dist RCA-1 lesion is 99% stenosed.   Non-stenotic Dist RCA-2 lesion.   A drug-eluting stent was successfully placed using a SYNERGY XD 3.0X24.   Post intervention, there is a 0% residual stenosis. Successful angioplasty and drug-eluting stent placement to the distal right coronary artery.  Difficult procedure overall due to severe tortuosity. Recommendations: Continue long-term dual antiplatelet therapy as tolerated. Aggressive treatment of risk factors. If LDL is not at target, consider treatment with a PCSK9 inhibitor given poor tolerance to potent statins.  ? ?CARDIAC CATHETERIZATION ? ?Result Date: 09/02/2021 ?CONCLUSIONS: Moderate diffuse in-stent restenosis in the proximal LAD stent obstructing the vessel by up to 60%.  RFR 0.93. Widely  patent left main Widely patent large circumflex Right coronary is the culprit.  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  Competitive flow in the PDA suggesting LAD septal perforator collaterals are present. Inferobasal hypokinesis.  Mid anterior wall hypokinesis.  EF 50%.  LVEDP 22 mmHg. RECOMMENDATIONS: Single-vessel coronary bypass versus high risk PCI with option for emergency surgery if vessel closure occurs.  There would also be option to treat the right coronary medically rather than with emergency surgery if the patient is tolerating occlusion in a stable manner. Plan continue dual antiplatelet therapy that the patient has been on leading to this hospital stay. Discussed with other members of the interventional team.  Have already discussed with [name] and [name].  ? ?ECHOCARDIOGRAM COMPLETE ? ?Result Date: 09/03/2021 ?   ECHOCARDIOGRAM REPORT   Patient Name:   Shelby Huffman Date of Exam: 09/03/2021 Medical Rec #:  [id number]       Height:       67.0 in Accession #:    [id number]      Weight:       160.0 lb Date of Birth:  24-Aug-1965       BSA:          1.839 m? Patient Age:    55 years        BP:           130/71 mmHg Patient Gender: F               HR:           59 bpm. Exam Location:  Inpatient Procedure: 2D Echo, Cardiac Doppler and Color Doppler Indications:    R07.9 CHEST PAIN  History:        Patient has no prior history of Echocardiogram examinations.                 CAD, Signs/Symptoms:Dyspnea and Shortness of Breath; Risk                 Factors:Hypertension and Dyslipidemia. TOBACCO ABUSE.  Sonographer:    Beryle Beams Referring Phys: Spelter  1. Left ventricular ejection fraction, by estimation, is 55 to 60%. The left ventricle has normal function. Left ventricular endocardial border not optimally defined to evaluate regional wall motion. Left ventricular diastolic parameters were normal.  2. Right ventricular  systolic function is normal. The right ventricular size is normal. Tricuspid regurgitation signal is  inadequate for assessing PA pressure.  3. The mitral valve is grossly normal. No evidence of mitral valve regurgitation. No evidence of mitral stenosis.  4. The aortic valve is tricuspid. Aortic valve regurgitation is trivial. No aortic stenosis is present.  5. The inferior vena cava is normal in size with greater than 50% respiratory variability, suggesting right atrial pressure of 3 mmHg. FINDINGS  Left Ventricle: Left ventricular ejection fraction, by estimation, is 55 to 60%. The left ventricle has normal function. Left ventricular endocardial border not optimally defined to evaluate regional wall motion. The left ventricular internal cavity size was normal in size. There is no left ventricular hypertrophy. Left ventricular diastolic parameters were normal. Right Ventricle: The right ventricular size is normal. No increase in right ventricular wall thickness. Right ventricular systolic function is normal. Tricuspid regurgitation signal is inadequate for assessing PA pressure. Left Atrium: Left atrial size was normal in size. Right Atrium: Right atrial size was normal in size. Pericardium: There is no evidence of pericardial effusion. Mitral Valve: The mitral valve is grossly normal. No evidence of mitral valve regurgitation. No evidence of mitral valve stenosis. Tricuspid Valve: The tricuspid valve is grossly normal. Tricuspid valve regurgitation is not demonstrated. No evidence of tricuspid stenosis. Aortic Valve: The aortic valve is tricuspid. Aortic valve regurgitation is trivial. No aortic stenosis is present. Aortic valve mean gradient measures 3.0 mmHg. Aortic valve peak gradient measures 5.6 mmHg. Aortic valve area, by VTI measures 2.48 cm?. Pulmonic Valve: The pulmonic valve was grossly normal. Pulmonic valve regurgitation is not visualized. No evidence of pulmonic stenosis. Aorta: The aortic root and  ascending aorta are structurally normal, with no evidence of dilitation. Venous: The inferior vena cava is normal in size with greater than 50% respiratory variability, suggesting right atrial pressure of 3

## 2021-09-09 ENCOUNTER — Telehealth (HOSPITAL_COMMUNITY): Payer: Self-pay

## 2021-09-09 NOTE — Telephone Encounter (Signed)
Per phase I cardiac rehab, fax cardiac rehab referral to [facility]. 

## 2021-09-16 ENCOUNTER — Encounter: Payer: Self-pay | Admitting: Cardiology

## 2021-09-16 ENCOUNTER — Other Ambulatory Visit: Payer: Self-pay | Admitting: Cardiology

## 2021-09-16 ENCOUNTER — Ambulatory Visit (INDEPENDENT_AMBULATORY_CARE_PROVIDER_SITE_OTHER): Payer: Medicaid Other | Admitting: Cardiology

## 2021-09-16 VITALS — BP 98/54 | HR 60 | Ht 67.0 in | Wt 165.0 lb

## 2021-09-16 DIAGNOSIS — I25119 Atherosclerotic heart disease of native coronary artery with unspecified angina pectoris: Secondary | ICD-10-CM

## 2021-09-16 DIAGNOSIS — I952 Hypotension due to drugs: Secondary | ICD-10-CM | POA: Diagnosis not present

## 2021-09-16 DIAGNOSIS — E7800 Pure hypercholesterolemia, unspecified: Secondary | ICD-10-CM | POA: Diagnosis not present

## 2021-09-16 DIAGNOSIS — I10 Essential (primary) hypertension: Secondary | ICD-10-CM

## 2021-09-16 MED ORDER — NEXLETOL 180 MG PO TABS: 180.0000 mg | ORAL_TABLET | Freq: Every day | ORAL | 11 refills | Status: DC

## 2021-09-16 NOTE — Progress Notes (Signed)
?Cardiology Office Note:   ? ?Date:  09/16/2021  ? ?IDLyndsie Huffman, DOB 12/11/1965, MRN [id number] ? ?PCP:  Zoila Shutter, NP  ?Cardiologist:  Shirlee More, MD   ? ?Referring MD: Zoila Shutter, NP  ? ? ?ASSESSMENT:   ? ?1. Coronary artery disease involving native coronary artery of native heart with angina pectoris ([place])   ?2. Hypercholesteremia   ?3. Essential hypertension   ?4. Hypotension due to drugs   ? ?PLAN:   ? ?In order of problems listed above: ? ?Stable course after recent PCI she will continue dual antiplatelet therapy for 12 months discontinue oral nitrate continue on low-dose beta-blocker Zetia and start bempedoic acid follow-up lipid profile CMP in 6 weeks.  With her arthritis she declines cardiac rehabilitation asked her to get 20 to 30 minutes of activity walking a day ?Stable discontinue oral nitrate ? ? ?Next appointment: 6 months ? ? ?Medication Adjustments/Labs and Tests Ordered: ?Current medicines are reviewed at length with the patient today.  Concerns regarding medicines are outlined above.  ?No orders of the defined types were placed in this encounter. ? ?No orders of the defined types were placed in this encounter. ? ? ?Chief Complaint  ?Patient presents with  ? Follow-up  ? Coronary Artery Disease  ? ? ?History of Present Illness:   ? ?Shelby Huffman is a 56 y.o. female with a hx of CAD with PCI and stent to LAD in 2017 hypertension hyperlipidemia chronic back pain and sinus bradycardia recently admitted to [facility] in transfer from [facility] after being admitted with chest pain and low-level troponin elevation.  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  She was last  seen at discharge 09/04/2021 .Marland Kitchen  Patient lipid profile showed residual LDL elevation 131 total cholesterol 181.  She was seen at [facility] with Dr. Geraldo Pitter. ?Compliance with diet, lifestyle and medications: Yes ? ?I reviewed medications with her she was taking Zetia prior to hospitalization but cannot tolerate statins.  She states she is taking multiple preparations high and low potency and none were tolerated. ?Fortunately she has not smoked since hospitalization has no intent to resume cigarette smoking ?She has had no angina edema shortness of breath palpitation or syncope ?She was placed on oral nitrate and has a dull headache and her blood pressure is relatively low 98/54 and we will stop her oral nitrate ?Past Medical History:  ?Diagnosis Date  ? Abnormal perimenopausal bleeding 01/09/2015  ? Allergic rhinitis   ? Anxiety   ? Coronary artery disease involving native coronary artery of native heart with angina pectoris ([place]) 06/03/2016  ? (a) 05/2016 DES to LAD and residual 60-70% stenosis in RCA  ? Dyslipidemia 05/17/2016  ? Dyspnea, unspecified   ? Environmental allergies   ? Essential hypertension 05/17/2016  ? GAD (generalized anxiety disorder) 04/15/2016  ? GERD (gastroesophageal reflux disease)   ? Hyperlipidemia   ? Hypokalemia 05/17/2016  ? Low back pain   ? Lumbago with sciatica, left side   ? Neuropathy   ? Polyosteoarthritis   ? Precordial pain 05/17/2016  ? Rheumatoid arthritis with rheumatoid factor (HCC)   ? SOB (shortness of breath) 05/17/2016  ? Tobacco abuse 05/17/2016  ? ? ?  Past Surgical History:  ?Procedure Laterality Date  ? bilateral tubal ligation    ? CARDIAC CATHETERIZATION    ? Stent placed  ? CORONARY STENT INTERVENTION N/A 09/03/2021  ? Procedure: CORONARY STENT INTERVENTION;  Surgeon: Wellington Hampshire, MD;  Location: [facility];  Service: Cardiovascular;  Laterality: N/A;  ? INTRAVASCULAR PRESSURE WIRE/FFR STUDY N/A 09/02/2021  ? Procedure: INTRAVASCULAR PRESSURE WIRE/FFR  STUDY;  Surgeon: Belva Crome, MD;  Location: [facility];  Service: Cardiovascular;  Laterality: N/A;  ? LEFT HEART CATH AND CORONARY ANGIOGRAPHY N/A 09/02/2021  ? Procedure: LEFT HEART CATH AND CORONARY ANGIOGRAPHY;  Surgeon: Belva Crome, MD;  Location: [facility];  Service: Cardiovascular;  Laterality: N/A;  ? ? ?Current Medications: ?Current Meds  ?Medication Sig  ? albuterol (VENTOLIN HFA) 108 (90 Base) MCG/ACT inhaler Inhale into the lungs.  ? ASPIRIN LOW DOSE 81 MG EC tablet Take 81 mg by mouth daily.  ? Cholecalciferol (VITAMIN D3) 1.25 MG (50000 UT) CAPS Take 50,000 Units by mouth once a week.  ? clopidogrel (PLAVIX) 75 MG tablet Take 75 mg by mouth daily.  ? ezetimibe (ZETIA) 10 MG tablet Take 10 mg by mouth daily.  ? isosorbide mononitrate (IMDUR) 30 MG 24 hr tablet Take 1 tablet (30 mg total) by mouth daily.  ? loratadine (CLARITIN) 10 MG tablet Take 10 mg by mouth daily.  ? metoprolol succinate (TOPROL-XL) 25 MG 24 hr tablet TAKE 1 TABLET BY MOUTH EVERY DAY  ? nitroGLYCERIN (NITROSTAT) 0.4 MG SL tablet Place 1 tablet (0.4 mg total) under the tongue every 5 (five) minutes as needed for chest pain.  ? Oxycodone HCl 10 MG TABS Take 10 mg by mouth 4 (four) times daily as needed for pain.  ? predniSONE (DELTASONE) 10 MG tablet Take 10 mg by mouth daily.  ? Vitamin D, Ergocalciferol, (DRISDOL) 1.25 MG (50000 UT) CAPS capsule Take 50,000 Units by mouth once a week.  ?  ? ?Allergies:   Atorvastatin, Duloxetine, Sulfamethazine, Penicillins, Statins, Morphine, and Sulfa antibiotics  ? ?Social History  ? ?Socioeconomic History  ? Marital status: Married  ?  Spouse name: Not on file  ? Number of children: Not on file  ? Years of education: Not on file  ? Highest education level: Not on file  ?Occupational History  ? Not on file  ?Tobacco Use  ? Smoking status: Every Day  ?  Packs/day: 1.00  ?  Types: Cigarettes  ?  Passive exposure: Current  ? Smokeless tobacco: Never  ?Substance and Sexual  Activity  ? Alcohol use: No  ? Drug use: No  ? Sexual activity: Never  ?  Birth control/protection: Surgical  ?Other Topics Concern  ? Not on file  ?Social History Narrative  ? Not on file  ? ?Social Determinants of Health  ? ?Financial Resource Strain: Not on file  ?Food Insecurity: Not on file  ?Transportation Needs: Not on file  ?Physical Activity: Not on file  ?Stress: Not on file  ?Social Connections: Not on file  ?  ? ?Family History: ?The patient's family history includes Breast cancer in her mother; Heart disease in her father; Hypertension in her father and mother; Kidney disease in her father. ?ROS:   ?Please see the history of present illness.    ?All other systems reviewed and are negative. ? ?EKGs/Labs/Other Studies Reviewed:   ? ?The following studies were reviewed today: ?Left Heart Cath 09/02/21 ?  ?CONCLUSIONS: ?Moderate diffuse in-stent restenosis  in the proximal LAD stent obstructing the vessel by up to 60%.  RFR 0.93. ?Widely patent left main ?Widely patent large circumflex ?Right coronary is the culprit.  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  Competitive flow in the PDA suggesting LAD septal perforator collaterals are present. ?Inferobasal hypokinesis.  Mid anterior wall hypokinesis.  EF 50%.  LVEDP 22 mmHg. ?RECOMMENDATIONS: ?  ?Single-vessel coronary bypass versus high risk PCI with option for emergency surgery if vessel closure occurs.  There would also be option to treat the right coronary medically rather than with emergency surgery if the patient is tolerating occlusion in a stable manner. ?Plan continue dual antiplatelet therapy that the patient has been on leading to this hospital stay. ?Discussed with other members of the interventional team.  Have already discussed with [name] and [name]. ?Diagnostic ?Dominance: Right ?  ?Coronary Stent Intervention  ?  Dist RCA-1 lesion is 99% stenosed. ?  Non-stenotic Dist RCA-2 lesion. ?   A drug-eluting stent was successfully placed using a SYNERGY XD 3.0X24. ?  Post intervention, there is a 0% residual stenosis. ?  ?Successful angioplasty and drug-eluting stent placement to the distal right coro

## 2021-09-16 NOTE — Patient Instructions (Signed)
Medication Instructions:  ?Your physician has recommended you make the following change in your medication:  ?Discontinue Isosorbide ?Start Nexlitol 180 mg once daily ? ?*If you need a refill on your cardiac medications before your next appointment, please call your pharmacy* ? ? ?Lab Work: ?Your physician recommends that you return for lab work in: Today for Lipid Panel and CMP ? ?If you have labs (blood work) drawn today and your tests are completely normal, you will receive your results only by: ?MyChart Message (if you have MyChart) OR ?A paper copy in the mail ?If you have any lab test that is abnormal or we need to change your treatment, we will call you to review the results. ? ? ?Testing/Procedures: ?NONE ? ? ?Follow-Up: ?At [facility], you and your health needs are our priority.  As part of our continuing mission to provide you with exceptional heart care, we have created designated Provider Care Teams.  These Care Teams include your primary Cardiologist (physician) and Advanced Practice Providers (APPs -  Physician Assistants and Nurse Practitioners) who all work together to provide you with the care you need, when you need it. ? ?We recommend signing up for the patient portal called "MyChart".  Sign up information is provided on this After Visit Summary.  MyChart is used to connect with patients for Virtual Visits (Telemedicine).  Patients are able to view lab/test results, encounter notes, upcoming appointments, etc.  Non-urgent messages can be sent to your provider as well.   ?To learn more about what you can do with MyChart, go to [email or web address].   ? ?Your next appointment:   ?6 month(s) ? ?The format for your next appointment:   ?In Person ? ?Provider:   ?Norman Herrlich, MD  ? ? ?Other Instructions ?  ?

## 2021-09-16 NOTE — Addendum Note (Signed)
Addended by: Roosvelt Harps R on: 09/16/2021 01:38 PM ? ? Modules accepted: Orders ? ?

## 2021-09-18 ENCOUNTER — Telehealth: Payer: Self-pay | Admitting: Cardiology

## 2021-09-18 LAB — LIPID PANEL
Chol/HDL Ratio: 4.5 ratio — ABNORMAL HIGH (ref 0.0–4.4)
Cholesterol, Total: 159 mg/dL (ref 100–199)
HDL: 35 mg/dL — ABNORMAL LOW (ref >39)
LDL Chol Calc (NIH): 111 mg/dL — ABNORMAL HIGH (ref 0–99)
Triglycerides: 63 mg/dL (ref 0–149)
VLDL Cholesterol Cal: 13 mg/dL (ref 5–40)

## 2021-09-18 LAB — COMPREHENSIVE METABOLIC PANEL
ALT: 12 IU/L (ref 0–32)
AST: 16 IU/L (ref 0–40)
Albumin/Globulin Ratio: 1.8 (ref 1.2–2.2)
Albumin: 4 g/dL (ref 3.8–4.9)
Alkaline Phosphatase: 60 IU/L (ref 44–121)
BUN/Creatinine Ratio: 30 — ABNORMAL HIGH (ref 9–23)
BUN: 25 mg/dL — ABNORMAL HIGH (ref 6–24)
Bilirubin Total: 0.5 mg/dL (ref 0.0–1.2)
CO2: 20 mmol/L (ref 20–29)
Calcium: 9 mg/dL (ref 8.7–10.2)
Chloride: 105 mmol/L (ref 96–106)
Creatinine, Ser: 0.84 mg/dL (ref 0.57–1.00)
Globulin, Total: 2.2 g/dL (ref 1.5–4.5)
Glucose: 100 mg/dL — ABNORMAL HIGH (ref 70–99)
Potassium: 3.8 mmol/L (ref 3.5–5.2)
Sodium: 142 mmol/L (ref 134–144)
Total Protein: 6.2 g/dL (ref 6.0–8.5)
eGFR: 82 mL/min/{1.73_m2} (ref >59)

## 2021-09-18 NOTE — Telephone Encounter (Signed)
Pt c/o medication issue: ? ?1. Name of Medication:  ? Bempedoic Acid (NEXLETOL) 180 MG TABS  ? ?2. How are you currently taking this medication (dosage and times per day)? Take 180 mg by mouth daily. ? ?3. Are you having a reaction (difficulty breathing--STAT)? No ? ?4. What is your medication issue? Pt states that pharmacy is stating that they need authorization before filling this medication. Please advise. ? ?[id number] - [place], [place] ?

## 2021-09-18 NOTE — Telephone Encounter (Signed)
Prior authorization sent for Nexletol  ? ?Rory Culbertson Key: [id number] ?Outcome ?Additional Information Required ?Your PA request cannot be processed for the member plan submitted. For further inquiries please contact the number on the back of the member prescription card. (Message 1002) ? ?I have sent a request to patients insurance [facility] for prior authorization waiting for response. ? ? ? ? ?

## 2021-09-23 ENCOUNTER — Telehealth: Payer: Self-pay

## 2021-09-23 NOTE — Telephone Encounter (Signed)
[employer] drug utilization review for Member ID # [id number] ?Nexletol 180 mg has been approved starting date 09/19/2021  ending date 09/19/2022 ?Patient may fill up to 34 day supply at a retail pharmacy.l ?Copy of review scanned to patients chart ?Patient has been notified of approval as well. ?

## 2021-11-26 ENCOUNTER — Telehealth: Payer: Self-pay | Admitting: Cardiology

## 2021-11-26 MED ORDER — NITROGLYCERIN 0.4 MG SL SUBL: 0.4000 mg | SUBLINGUAL_TABLET | SUBLINGUAL | 6 refills | Status: DC | PRN

## 2021-11-26 NOTE — Telephone Encounter (Signed)
Spoke with pt who states that she is very tired and can fall asleep anytime. Pt reports having dull chest pain. Pt does not have any BP readings/HR and has not taken NTG as she needed a refill which has been sent. Advised to go to the ED but told her I would route to Dr. Dulce Sellar as well.  09/02/21 cath  CONCLUSIONS: Moderate diffuse in-stent restenosis in the proximal LAD stent obstructing the vessel by up to 60%.  RFR 0.93. Widely patent left main Widely patent large circumflex Right coronary is the culprit.  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  Competitive flow in the PDA suggesting LAD septal perforator collaterals are present. Inferobasal hypokinesis.  Mid anterior wall hypokinesis.  EF 50%.  LVEDP 22 mmHg. RECOMMENDATIONS:   Single-vessel coronary bypass versus high risk PCI with option for emergency surgery if vessel closure occurs.  There would also be option to treat the right coronary medically rather than with emergency surgery if the patient is tolerating occlusion in a stable manner. Plan continue dual antiplatelet therapy that the patient has been on leading to this hospital stay. Discussed with other members of the interventional team.  Have already discussed with Herbie Baltimore and Tresa Endo.

## 2021-11-26 NOTE — Telephone Encounter (Signed)
Left a VM for pt to call back.

## 2021-11-26 NOTE — Telephone Encounter (Signed)
Pt c/o of Chest Pain: STAT if CP now or developed within 24 hours  1. Are you having CP right now? Yes   2. Are you experiencing any other symptoms (ex. SOB, nausea, vomiting, sweating)? SOB; very tired and sleepy  3. How long have you been experiencing CP? 2 weeks  4. Is your CP continuous or coming and going? Coming and going  5. Have you taken Nitroglycerin? No  ?

## 2021-11-26 NOTE — Telephone Encounter (Signed)
Recommendations reviewed with pt as per Dr. Munley's note.  Pt verbalized understanding and had no additional questions.  

## 2021-11-26 NOTE — Addendum Note (Signed)
Addended by: Eleonore Chiquito on: 11/26/2021 03:49 PM   Modules accepted: Orders

## 2021-12-18 ENCOUNTER — Telehealth: Payer: Self-pay | Admitting: Cardiology

## 2021-12-18 NOTE — Telephone Encounter (Signed)
Spoke with pt. She stated that her diuretic was discontinued in the hospital in March. Since then she has gradually gained weight 20lbs since March. She reported being sleepy, some mild leg swelling, some shortness of breath when walking. She last saw her PCP last week and her blood pressure was good. She stated that she is concerned about her aneurysm. Please Advise.

## 2021-12-18 NOTE — Telephone Encounter (Signed)
Pt c/o swelling: STAT is pt has developed SOB within 24 hours  If swelling, where is the swelling located?   Stomach, arms, all over  How much weight have you gained and in what time span?   20 lbs since March  Have you gained 3 pounds in a day or 5 pounds in a week?  No  Do you have a log of your daily weights (if so, list)?   Yes  Are you currently taking a fluid pill?   No  Are you currently SOB?   No  Have you traveled recently?   No   Patient stated she was taken off the fluid pill in the hospital in March.  She is concerned about her aneurysm.

## 2021-12-29 ENCOUNTER — Telehealth: Payer: Self-pay

## 2021-12-29 MED ORDER — FUROSEMIDE 20 MG PO TABS: 20.0000 mg | ORAL_TABLET | Freq: Every day | ORAL | 3 refills | Status: DC

## 2021-12-29 MED ORDER — POTASSIUM CHLORIDE ER 10 MEQ PO TBCR: 10.0000 meq | EXTENDED_RELEASE_TABLET | Freq: Every day | ORAL | 3 refills | Status: DC

## 2021-12-29 NOTE — Telephone Encounter (Signed)
Pt called regarding message she had left for Dr. Dulce Sellar. Advised her of Dr. Thersa Salt recommendations and she was in agreement. She verbalized understanding and had no further questions. She will keep a log of her weights and follow up in a week. Encouraged her to call if she has any problems or issues.

## 2021-12-29 NOTE — Telephone Encounter (Signed)
LVM to call regarding message. 

## 2022-01-12 ENCOUNTER — Other Ambulatory Visit: Payer: Self-pay

## 2022-01-12 NOTE — Telephone Encounter (Signed)
This patients questions were already addressed by Baldo Ash.

## 2022-04-01 ENCOUNTER — Other Ambulatory Visit: Payer: Self-pay

## 2022-04-01 DIAGNOSIS — E785 Hyperlipidemia, unspecified: Secondary | ICD-10-CM | POA: Insufficient documentation

## 2022-04-02 ENCOUNTER — Ambulatory Visit: Payer: Medicaid Other | Attending: Cardiology | Admitting: Cardiology

## 2022-04-02 ENCOUNTER — Encounter: Payer: Self-pay | Admitting: Cardiology

## 2022-04-02 VITALS — BP 128/82 | HR 62 | Ht 67.0 in | Wt 184.6 lb

## 2022-04-02 DIAGNOSIS — I25119 Atherosclerotic heart disease of native coronary artery with unspecified angina pectoris: Secondary | ICD-10-CM | POA: Diagnosis not present

## 2022-04-02 DIAGNOSIS — Z789 Other specified health status: Secondary | ICD-10-CM | POA: Diagnosis not present

## 2022-04-02 DIAGNOSIS — I10 Essential (primary) hypertension: Secondary | ICD-10-CM | POA: Diagnosis not present

## 2022-04-02 DIAGNOSIS — E7800 Pure hypercholesterolemia, unspecified: Secondary | ICD-10-CM

## 2022-04-02 MED ORDER — EVOLOCUMAB 140 MG/ML ~~LOC~~ SOSY: 140.0000 mg | PREFILLED_SYRINGE | SUBCUTANEOUS | 3 refills | Status: DC

## 2022-04-02 NOTE — Patient Instructions (Signed)
Medication Instructions:  Your physician has recommended you make the following change in your medication:  Stop taking Nexlatol Start Repatha 140 mg injection once every 2 weeks  *If you need a refill on your cardiac medications before your next appointment, please call your pharmacy*   Lab Work:Your physician recommends that you return for lab work in: 2 months for a Fasting Lipid Profile and Complete Metabolic Panel Lab opens at 8am. You DO NOT NEED an appointment. Best time to come is between 8am and 12noon and between 1:30 and 4:30. If you have been asked to fast for your blood work please have nothing to eat or drink after midnight. You may have water.      Testing/Procedures: NONE   Follow-Up: At [facility], you and your health needs are our priority.  As part of our continuing mission to provide you with exceptional heart care, we have created designated Provider Care Teams.  These Care Teams include your primary Cardiologist (physician) and Advanced Practice Providers (APPs -  Physician Assistants and Nurse Practitioners) who all work together to provide you with the care you need, when you need it.  We recommend signing up for the patient portal called "MyChart".  Sign up information is provided on this After Visit Summary.  MyChart is used to connect with patients for Virtual Visits (Telemedicine).  Patients are able to view lab/test results, encounter notes, upcoming appointments, etc.  Non-urgent messages can be sent to your provider as well.   To learn more about what you can do with MyChart, go to [email or web address].    Your next appointment:   6 month(s)  The format for your next appointment:   In Person  Provider:   Jenne Campus, MD or Shirlee More, MD    Other Instructions   Important Information About Sugar

## 2022-04-02 NOTE — Progress Notes (Signed)
Cardiology Office Note:    Date:  04/02/2022   ID:  Shelby Huffman, DOB [birth year], MRN [id number]  PCP:  Zoila Shutter, NP  Cardiologist:  Shirlee More, MD    Referring MD: Zoila Shutter, NP    ASSESSMENT:    1. Hypercholesteremia   2. Coronary artery disease involving native coronary artery of native heart with angina pectoris ([place])   3. Essential hypertension   4. Statin intolerance    PLAN:    In order of problems listed above:  Unfortunately she is intolerant of statins and now bempedoic acid.  With her recurrent cardiac intervention she requires optimal therapy we will transition to Repatha plus Zetia 2 months check lipids CMP and I will see her in 6 months around the 1 year anniversary of her PCI and stent and I would favor monotherapy with clopidogrel at that time BP is at target continue treatment including her loop diuretic beta-blocker Lipids 2 months   Next appointment: She will be seen in the office 6 months   Medication Adjustments/Labs and Tests Ordered: Current medicines are reviewed at length with the patient today.  Concerns regarding medicines are outlined above.  No orders of the defined types were placed in this encounter.  No orders of the defined types were placed in this encounter.   Chief Complaint  Patient presents with   Coronary Artery Disease    She is concerned about foot pain that she feels is related to her lipid-lowering treatment    History of Present Illness:    Shelby Huffman is a 56 y.o. female with a hx of CAD with PCI and stent to LAD in 2017 hypertension hyperlipidemia with statin intolerance chronic back pain and sinus bradycardia recently admitted to [facility] in transfer from [place] after being admitted with chest pain and low-level troponin elevation.  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 She was last seen 09/16/2021.  Compliance with diet, lifestyle and medications: Yes  She does not feel well she is having the same symptoms that she had with a statin of lower extremity pain and were going to discontinue bempedoic acid she is statin intolerant continue Zetia and start Repatha. She has had no angina edema shortness of breath palpitation or syncope  Procedures 09/04/2021 thank  CORONARY STENT INTERVENTION   Conclusion      Dist RCA-1 lesion is 99% stenosed.   Non-stenotic Dist RCA-2 lesion.   A drug-eluting stent was successfully placed using a SYNERGY XD 3.0X24.   Post intervention, there is a 0% residual stenosis.   Successful angioplasty and drug-eluting stent placement to the distal right coronary artery.  Difficult procedure overall due to severe tortuosity.   Coronary Diagrams  Diagnostic Dominance: Right  Intervention   Past Medical History:  Diagnosis Date   Abnormal perimenopausal bleeding 01/09/2015   Allergic rhinitis    Anxiety    Chronic bilateral low back pain with right-sided sciatica 03/05/2016   Coronary artery disease involving native coronary artery of native heart with angina pectoris ([place]) 06/03/2016   (a) 05/2016 DES to LAD and residual 60-70% stenosis in RCA   Dyslipidemia 05/17/2016   Dyspnea, unspecified    Environmental allergies  Essential hypertension 05/17/2016   GAD (generalized anxiety disorder) 04/15/2016   GERD (gastroesophageal reflux disease)    Heartburn 01/17/2019   Hypercholesteremia    Hyperlipidemia    Hypokalemia 05/17/2016   Low back pain    Lumbago with sciatica, left side    Neuropathy    Polyosteoarthritis    Precordial pain 05/17/2016   Rheumatoid arthritis with rheumatoid factor (HCC)    SOB (shortness of breath)  05/17/2016   Tobacco abuse 05/17/2016   Unstable angina ([place]) 09/02/2021    Past Surgical History:  Procedure Laterality Date   bilateral tubal ligation     CARDIAC CATHETERIZATION     Stent placed   CORONARY STENT INTERVENTION N/A 09/03/2021   Procedure: CORONARY STENT INTERVENTION;  Surgeon: Wellington Hampshire, MD;  Location: [place] [facility];  Service: Cardiovascular;  Laterality: N/A;   INTRAVASCULAR PRESSURE WIRE/FFR STUDY N/A 09/02/2021   Procedure: INTRAVASCULAR PRESSURE WIRE/FFR STUDY;  Surgeon: Belva Crome, MD;  Location: [place] [facility];  Service: Cardiovascular;  Laterality: N/A;   LEFT HEART CATH AND CORONARY ANGIOGRAPHY N/A 09/02/2021   Procedure: LEFT HEART CATH AND CORONARY ANGIOGRAPHY;  Surgeon: Belva Crome, MD;  Location: [place] [facility];  Service: Cardiovascular;  Laterality: N/A;    Current Medications: Current Meds  Medication Sig   albuterol (VENTOLIN HFA) 108 (90 Base) MCG/ACT inhaler Inhale 1-2 puffs into the lungs as needed for wheezing or shortness of breath.   ASPIRIN LOW DOSE 81 MG EC tablet Take 81 mg by mouth daily.   Cholecalciferol (VITAMIN D3) 1.25 MG (50000 UT) CAPS Take 50,000 Units by mouth once a week.   clopidogrel (PLAVIX) 75 MG tablet Take 75 mg by mouth daily.   ezetimibe (ZETIA) 10 MG tablet Take 10 mg by mouth daily.   furosemide (LASIX) 20 MG tablet Take 1 tablet (20 mg total) by mouth daily.   KLOR-CON M20 20 MEQ tablet Take 20 mEq by mouth daily.   loratadine (CLARITIN) 10 MG tablet Take 10 mg by mouth daily.   metoprolol succinate (TOPROL-XL) 50 MG 24 hr tablet Take 50 mg by mouth daily.   nitroGLYCERIN (NITROSTAT) 0.4 MG SL tablet Place 0.4 mg under the tongue every 5 (five) minutes as needed for chest pain.   Oxycodone HCl 10 MG TABS Take 10 mg by mouth 4 (four) times daily as needed for pain.   potassium chloride (KLOR-CON) 10 MEQ tablet Take 1 tablet (10 mEq total) by mouth daily.   predniSONE (DELTASONE) 10 MG tablet Take 10  mg by mouth daily.   Vitamin D, Ergocalciferol, (DRISDOL) 1.25 MG (50000 UT) CAPS capsule Take 50,000 Units by mouth once a week.   [DISCONTINUED] Bempedoic Acid (NEXLETOL) 180 MG TABS Take 180 mg by mouth daily.     Allergies:   Atorvastatin, Duloxetine, Sulfamethazine, Penicillins, Statins, Morphine, and Sulfa antibiotics   Social History   Socioeconomic History   Marital status: Married    Spouse name: Not on file   Number of children: Not on file   Years of education: Not on file   Highest education level: Not on file  Occupational History   Not on file  Tobacco Use   Smoking status: Every Day    Packs/day: 1.00    Types: Cigarettes    Passive exposure: Current   Smokeless tobacco: Never  Substance and Sexual Activity   Alcohol use: No   Drug use: No   Sexual activity: Never    Birth control/protection:  Surgical  Other Topics Concern   Not on file  Social History Narrative   Not on file   Social Determinants of Health   Financial Resource Strain: Not on file  Food Insecurity: Not on file  Transportation Needs: Not on file  Physical Activity: Not on file  Stress: Not on file  Social Connections: Not on file     Family History: The patient's family history includes Breast cancer in her mother; Heart disease in her father; Hypertension in her father and mother; Kidney disease in her father. ROS:   Please see the history of present illness.    All other systems reviewed and are negative.  EKGs/Labs/Other Studies Reviewed:    The following studies were reviewed today:    Recent Labs: 09/04/2021: Hemoglobin 14.5; Platelets 110 09/16/2021: ALT 12; BUN 25; Creatinine, Ser 0.84; Potassium 3.8; Sodium 142  Recent Lipid Panel    Component Value Date/Time   CHOL 159 09/16/2021 1353   TRIG 63 09/16/2021 1353   HDL 35 (L) 09/16/2021 1353   CHOLHDL 4.5 (H) 09/16/2021 1353   CHOLHDL 5.2 09/04/2021 0732   VLDL 15 09/04/2021 0732   LDLCALC 111 (H) 09/16/2021 1353     Physical Exam:    VS:  BP 128/82   Pulse 62   Ht 5\' 7"  (1.702 m)   Wt 184 lb 9.6 oz (83.7 kg)   SpO2 96%   BMI 28.91 kg/m     Wt Readings from Last 3 Encounters:  04/02/22 184 lb 9.6 oz (83.7 kg)  09/16/21 165 lb (74.8 kg)  09/02/21 160 lb (72.6 kg)     GEN:  Well nourished, well developed in no acute distress HEENT: Normal NECK: No JVD; No carotid bruits LYMPHATICS: No lymphadenopathy CARDIAC: RRR, no murmurs, rubs, gallops RESPIRATORY:  Clear to auscultation without rales, wheezing or rhonchi  ABDOMEN: Soft, non-tender, non-distended MUSCULOSKELETAL:  No edema; No deformity  SKIN: Warm and dry NEUROLOGIC:  Alert and oriented x 3 PSYCHIATRIC:  Normal affect    Signed, Shirlee More, MD  04/02/2022 2:17 PM    [facility]

## 2022-04-06 ENCOUNTER — Telehealth: Payer: Self-pay

## 2022-04-06 NOTE — Telephone Encounter (Signed)
PA submitted on CMM for Repatha 140 mg. Key [id number]

## 2022-04-07 NOTE — Telephone Encounter (Signed)
Message from Plan Approved. This drug has been approved. Approved quantity: 6 milliliters per 84 day(s). You may fill up to a 34 day supply at a retail pharmacy. You may fill up to a 90 day supply for maintenance drugs, please refer to the formulary for details. Please call the pharmacy to process your prescription claim. Copy of approval letter in media

## 2022-07-14 ENCOUNTER — Ambulatory Visit: Payer: Medicaid Other | Admitting: Podiatry

## 2022-07-14 DIAGNOSIS — L600 Ingrowing nail: Secondary | ICD-10-CM | POA: Diagnosis not present

## 2022-07-14 MED ORDER — CLINDAMYCIN HCL 300 MG PO CAPS: 300.0000 mg | ORAL_CAPSULE | Freq: Three times a day (TID) | ORAL | 0 refills | Status: AC

## 2022-07-14 NOTE — Progress Notes (Signed)
Subjective:  Patient ID: Shelby Huffman, female    DOB: Dec 26, 1965,  MRN: [id number]  Chief Complaint  Patient presents with   Ingrown Toenail    right big toe ingrown nail   Nail Problem    Nail fungus- has tried OTC medication     57 y.o. female presents with concern for pain in the right great toe at the lateral border.  Has pain with pressure on the area.  Has noticed some redness and swelling.  Denies any drainage.  Past Medical History:  Diagnosis Date   Abnormal perimenopausal bleeding 01/09/2015   Allergic rhinitis    Anxiety    Chronic bilateral low back pain with right-sided sciatica 03/05/2016   Coronary artery disease involving native coronary artery of native heart with angina pectoris ([place]) 06/03/2016   (a) 05/2016 DES to LAD and residual 60-70% stenosis in RCA   Dyslipidemia 05/17/2016   Dyspnea, unspecified    Environmental allergies    Essential hypertension 05/17/2016   GAD (generalized anxiety disorder) 04/15/2016   GERD (gastroesophageal reflux disease)    Heartburn 01/17/2019   Hypercholesteremia    Hyperlipidemia    Hypokalemia 05/17/2016   Low back pain    Lumbago with sciatica, left side    Neuropathy    Polyosteoarthritis    Precordial pain 05/17/2016   Rheumatoid arthritis with rheumatoid factor (HCC)    SOB (shortness of breath) 05/17/2016   Tobacco abuse 05/17/2016   Unstable angina (HCC) 09/02/2021    Allergies  Allergen Reactions   Atorvastatin Other (See Comments)   Duloxetine Other (See Comments)   Sulfamethazine Other (See Comments)    TOLD BY MOTHER SHE IS ALLERGIC   Penicillins Hives   Statins     Other reaction(s): muscle cramps   Morphine Nausea Only   Sulfa Antibiotics Other (See Comments) and Nausea And Vomiting    Was told by mother that she was allergic.    ROS: Negative except as per HPI above  Objective:  General: AAO x3, NAD  Dermatological: Incurvation is present along the lateral nail border of the right great toe. There  is localized edema without any erythema or increase in warmth around the nail border. There is no drainage or pus. There is no ascending cellulitis. No malodor. No open lesions or pre-ulcerative lesions.    Vascular:  Dorsalis Pedis artery and Posterior Tibial artery pedal pulses are 2/4 bilateral.  Capillary fill time < 3 sec to all digits.   Neruologic: Grossly intact via light touch bilateral. Protective threshold intact to all sites bilateral.   Musculoskeletal: No gross boney pedal deformities bilateral. No pain, crepitus, or limitation noted with foot and ankle range of motion bilateral. Muscular strength 5/5 in all groups tested bilateral.  Gait: Unassisted, Nonantalgic.   No images are attached to the encounter.   Assessment:   1. Ingrown nail of great toe of right foot      Plan:  Patient was evaluated and treated and all questions answered.    Ingrown Nail, right -Patient elects to proceed with minor surgery to remove ingrown toenail today. Consent reviewed and signed by patient. -Ingrown nail excised. See procedure note. -Educated on post-procedure care including soaking. Written instructions provided and reviewed. -Patient to follow up in 2 weeks for nail check. -E- Rx for clindamycin 500 mg 3 times daily per patient request due to concern for antibiotic.  Discussed risk of antibiotic therapy.  Procedure: Excision of Ingrown Toenail Location: Right 1st toe lateral nail borders.  Anesthesia: Lidocaine 1% plain; 1.5 mL and Marcaine 0.5% plain; 1.5 mL, digital block. Skin Prep: Betadine. Dressing: Silvadene; telfa; dry, sterile, compression dressing. Technique: Following skin prep, the toe was exsanguinated and a tourniquet was secured at the base of the toe. The affected nail border was freed, split with a nail splitter, and excised. Chemical matrixectomy was then performed with phenol and irrigated out with alcohol. The tourniquet was then removed and sterile dressing  applied. Disposition: Patient tolerated procedure well. Patient to return in 2 weeks for follow-up.    Return in about 2 weeks (around 07/28/2022) for f/u R hallux lateral border ingrown.          Everitt Amber, DPM [facility]

## 2022-07-14 NOTE — Patient Instructions (Signed)

## 2022-07-28 ENCOUNTER — Ambulatory Visit: Payer: Medicaid Other | Admitting: Podiatry

## 2022-07-28 DIAGNOSIS — L600 Ingrowing nail: Secondary | ICD-10-CM

## 2022-07-28 MED ORDER — CICLOPIROX 8 % EX SOLN: Freq: Every day | CUTANEOUS | 0 refills | Status: AC

## 2022-07-28 NOTE — Progress Notes (Signed)
Subjective: Shelby Huffman is a 57 y.o.  female returns to office today for follow up evaluation after having right Hallux lateral border nail ingrown removal with phenol and alcohol matrixectomy approximately 2 weeks ago. Patient has been soaking using epsom salt and applying topical antibiotic covered with bandaid daily. Patient denies fevers, chills, nausea, vomiting. Denies any calf pain, chest pain, SOB.  Patient also inquires about antifungal medication for the right hallux nail.  Objective:  Vitals: Reviewed  General: Well developed, nourished, in no acute distress, alert and oriented x3   Dermatology: Skin is warm, dry and supple bilateral.  Right hallux nail border appears to be clean, dry, with mild granular tissue and surrounding scab. There is no surrounding erythema, edema, drainage/purulence. The remaining nails appear unremarkable at this time. There are no other lesions or other signs of infection present.  Neurovascular status: Intact. No lower extremity swelling; No pain with calf compression bilateral.  Musculoskeletal: Decreased tenderness to palpation of the right hallux nail fold(s). Muscular strength within normal limits bilateral.   Assesement and Plan: #S/p phenol and alcohol matrixectomy to the  hallux nail lateral, doing well.   -Continue soaking in epsom salts twice a day followed by antibiotic ointment and a band-aid. Can leave uncovered at night. Continue this until completely healed.  -If the area has not healed in 2 weeks, call the office for follow-up appointment, or sooner if any problems arise.  -Monitor for any signs/symptoms of infection. Call the office immediately if any occur or go directly to the emergency room. Call with any questions/concerns.   Onychomycosis bilaterally -Discussed topical and oral antifungal. -Discussed risk of liver toxicity with oral antifungal terbinafine.  Patient wishes to defer due to this side effect risk possibility -Will  proceed with topical antifungal for the next 3 months Penlac 8% solution apply daily topically to all nails.        Everitt Amber, [place] [facility]                   07/28/2022

## 2022-09-08 ENCOUNTER — Ambulatory Visit: Payer: Medicaid Other | Admitting: Podiatry

## 2022-09-08 DIAGNOSIS — M722 Plantar fascial fibromatosis: Secondary | ICD-10-CM

## 2022-09-08 MED ORDER — MELOXICAM 15 MG PO TABS: 15.0000 mg | ORAL_TABLET | Freq: Every day | ORAL | 0 refills | Status: DC

## 2022-09-08 NOTE — Patient Instructions (Signed)

## 2022-09-08 NOTE — Progress Notes (Signed)
  Subjective:  Patient ID: Shelby Huffman, female    DOB: 1965-09-04,  MRN: [id number]  Chief Complaint  Patient presents with   Foot Pain    Rt heel pain pt stated that her primary dr gave her shot in the heel a few months ago and she has been having pain ever since    56 y.o. female presents with the above complaint. Pt has pain at right heel, pain worse in [place] when getting out of bed.    Review of Systems: Negative except as noted in the HPI. Denies N/V/F/Ch.   Objective:  There were no vitals filed for this visit. There is no height or weight on file to calculate BMI. Constitutional Well developed. Well nourished.  Vascular Dorsalis pedis pulses palpable bilaterally. Posterior tibial pulses palpable bilaterally. Capillary refill normal to all digits.  No cyanosis or clubbing noted. Pedal hair growth normal.  Neurologic Normal speech. Oriented to person, place, and time. Epicritic sensation to light touch grossly present bilaterally.  Dermatologic Nails well groomed and normal in appearance. No open wounds. No skin lesions.  Orthopedic: Normal joint ROM without pain or crepitus bilaterally. No visible deformities. Tender to palpation at the calcaneal tuber right. No pain with calcaneal squeeze right. Ankle ROM diminished range of motion right. Silfverskiold Test: negative right.   Radiographs: Deferred  Assessment:   1. Plantar fasciitis, right    Plan:  Patient was evaluated and treated and all questions answered.  Plantar Fasciitis, right - XR reviewed as above.  - Educated on icing and stretching. Instructions given.  - Injection delivered to the plantar fascia as below. - DME: [employer] dispensed - Pharmacologic management: Meloxicam 15 mg take once daily for the next 30 days. Educated on risks/benefits and proper taking of medication.  Procedure: Injection Tendon/Ligament Location: Right plantar fascia at the glabrous junction; medial approach. Skin  Prep: alcohol Injectate: 1 cc 0.5% marcaine plain, 1 cc kenalog 10. Disposition: Patient tolerated procedure well. Injection site dressed with a band-aid.  Return in about 4 weeks (around 10/06/2022) for Follow-up right plantar fasciitis.

## 2022-10-09 ENCOUNTER — Other Ambulatory Visit: Payer: Self-pay | Admitting: Podiatry

## 2022-10-16 ENCOUNTER — Telehealth: Payer: Self-pay | Admitting: Cardiology

## 2022-10-16 NOTE — Telephone Encounter (Signed)
Pt states that she is not allergic to bempedoic acid (Nexletol) as she thought her pain in her feet was caused by the medication. After stopping the medication she continues to have the same pain. Pt would like to try Nexletol or Nexlizet as she is on zetia. Please advise.

## 2022-10-16 NOTE — Telephone Encounter (Signed)
Spoke with pt who states that she does not like taking the Repatha injections as she is very nervous doing the shots and it causes big bruises at the site. She states that the pain in her feet that she felt was caused by the Nexletol has not changed since not taking it. She would like to return to taking Nexletol and dc Repatha. Please advise.

## 2022-10-16 NOTE — Telephone Encounter (Signed)
Pt has decided to stay on Repatha at this time.

## 2022-10-16 NOTE — Telephone Encounter (Signed)
Pt c/o medication issue:  1. Name of Medication: Evolocumab 140 MG/ML SOSY   2. How are you currently taking this medication (dosage and times per day)? Inject 140 mg into the skin every 14 (fourteen) days.   3. Are you having a reaction (difficulty breathing--STAT)? No  4. What is your medication issue? Pt would like a callback regarding switching back to the pill medication she was on before. She stated this is not working for her. Please advise

## 2022-10-26 ENCOUNTER — Ambulatory Visit (INDEPENDENT_AMBULATORY_CARE_PROVIDER_SITE_OTHER): Payer: Medicaid Other | Admitting: Podiatry

## 2022-10-26 DIAGNOSIS — Z91199 Patient's noncompliance with other medical treatment and regimen due to unspecified reason: Secondary | ICD-10-CM

## 2022-10-26 NOTE — Progress Notes (Signed)
Pt was a no show for apt, [place] 

## 2022-10-27 ENCOUNTER — Ambulatory Visit: Payer: Medicaid Other | Admitting: Podiatry

## 2022-10-29 ENCOUNTER — Ambulatory Visit: Payer: Medicaid Other | Attending: Internal Medicine | Admitting: Internal Medicine

## 2022-10-29 ENCOUNTER — Encounter: Payer: Self-pay | Admitting: Internal Medicine

## 2022-10-29 ENCOUNTER — Ambulatory Visit: Payer: Medicaid Other

## 2022-10-29 ENCOUNTER — Ambulatory Visit (INDEPENDENT_AMBULATORY_CARE_PROVIDER_SITE_OTHER): Payer: Medicaid Other

## 2022-10-29 VITALS — BP 134/84 | HR 70 | Resp 15 | Ht 65.5 in | Wt 195.0 lb

## 2022-10-29 DIAGNOSIS — M5441 Lumbago with sciatica, right side: Secondary | ICD-10-CM

## 2022-10-29 DIAGNOSIS — M79641 Pain in right hand: Secondary | ICD-10-CM | POA: Diagnosis not present

## 2022-10-29 DIAGNOSIS — M0579 Rheumatoid arthritis with rheumatoid factor of multiple sites without organ or systems involvement: Secondary | ICD-10-CM

## 2022-10-29 DIAGNOSIS — M0580 Other rheumatoid arthritis with rheumatoid factor of unspecified site: Secondary | ICD-10-CM

## 2022-10-29 DIAGNOSIS — M79642 Pain in left hand: Secondary | ICD-10-CM | POA: Diagnosis not present

## 2022-10-29 DIAGNOSIS — M722 Plantar fascial fibromatosis: Secondary | ICD-10-CM

## 2022-10-29 DIAGNOSIS — M159 Polyosteoarthritis, unspecified: Secondary | ICD-10-CM

## 2022-10-29 DIAGNOSIS — G8929 Other chronic pain: Secondary | ICD-10-CM

## 2022-10-29 HISTORY — DX: Plantar fascial fibromatosis: M72.2

## 2022-10-29 NOTE — Progress Notes (Signed)
Office Visit Note  Patient: Shelby Huffman             Date of Birth: 10/29/1965           MRN: [id number]             PCP: Hortencia Conradi, NP Referring: Hortencia Conradi, NP Visit Date: 10/29/2022   Subjective:  New Patient (Initial Visit) (Total body joint pain)   History of Present Illness: Shelby Huffman is a 57 y.o. female here for evaluation of chronic joint pain in multiple areas and positive rheumatoid factor.  She has pretty diffuse joint pains that have been ongoing for years he previously had evaluation with positive rheumatoid factor and referral to [facility] rheumatology in 2020 but was not able to make appointments due to her multiple obligations with work and taking care of family members at home.  Her most problematic joint pain has been chronic low back pain with some known lumbar degenerative disc disease for which she is on long-term oxycodone which is mostly beneficial.  Still experiences episodic muscle cramping type of pain previously happened more often in the abdominal region now more often at the right lower back.  Also gets pain with both hands and wrists most commonly bothers her at nighttime.  She sees some associated swelling also most often at nighttime occasionally pain radiates up to the forearm does not have prolonged morning stiffness or numbness does not get any numbness provoked with activity such as driving.  She also has chronic pain at both feet mostly bothering her at the bottom of the heels.  This is most severe first thing in the morning getting out of bed and when standing up has been told by podiatry she has significant bone spurs that may need surgery on them.  Currently on meloxicam 15 mg daily for this which is partially helpful.  Before that she was taking naproxen that was partially helpful.  Also on gabapentin 100 mg.  She was previously prescribed duloxetine with some reaction or medication intolerance with this around 2017.  Also gets some  pain at the hips and knees recently has been a lot worse with lateral hip pain extending down the outside edge of the thigh.  This gets worse with direct pressure on the area such as lying on her side. Medical history is also significant for coronary artery disease with previous stent placement in 2017 she had repeat catheterization last year for restenosis of the LAD.  She was a chronic long-term smoker of about 1-1/2 packs/day but stopped smoking for the past year.  She has had unintentional weight gain of 30 pounds compared to documented measurements 1 year ago.  Labs reviewed RF 32.9  Activities of Daily Living:  Patient reports morning stiffness for 5 minutes.   Patient Reports nocturnal pain.  Difficulty dressing/grooming: Denies Difficulty climbing stairs: Denies Difficulty getting out of chair: Denies Difficulty using hands for taps, buttons, cutlery, and/or writing: Denies  Review of Systems  Constitutional:  Positive for fatigue.  HENT:  Negative for mouth sores and mouth dryness.   Eyes:  Negative for dryness.  Respiratory:  Positive for shortness of breath.   Cardiovascular:  Positive for palpitations. Negative for chest pain.  Gastrointestinal:  Negative for blood in stool, constipation and diarrhea.  Endocrine: Negative for increased urination.  Genitourinary:  Negative for involuntary urination.  Musculoskeletal:  Positive for joint pain, joint pain, joint swelling and morning stiffness. Negative for gait problem, myalgias, muscle  weakness, muscle tenderness and myalgias.  Skin:  Negative for color change, rash, hair loss and sensitivity to sunlight.  Allergic/Immunologic: Negative for susceptible to infections.  Neurological:  Positive for headaches. Negative for dizziness.  Hematological:  Negative for swollen glands.  Psychiatric/Behavioral:  Positive for depressed mood. Negative for sleep disturbance. The patient is not nervous/anxious.     PMFS History:  Patient  Active Problem List   Diagnosis Date Noted   Bilateral plantar fasciitis 10/29/2022   Hyperlipidemia 04/01/2022   Unstable angina (HCC) 09/02/2021   Heartburn 01/17/2019   Allergic rhinitis    Anxiety    Environmental allergies    Hypercholesteremia    Polyosteoarthritis    Polyarthritis with positive rheumatoid factor (HCC)    Low back pain    Lumbago with sciatica, left side    Dyspnea, unspecified    Neuropathy    GERD (gastroesophageal reflux disease)    Coronary artery disease involving native coronary artery of native heart with angina pectoris (HCC) 06/03/2016   Precordial pain 05/17/2016   Dyslipidemia 05/17/2016   Essential hypertension 05/17/2016   Hypokalemia 05/17/2016   SOB (shortness of breath) 05/17/2016   Tobacco abuse 05/17/2016   GAD (generalized anxiety disorder) 04/15/2016   Chronic bilateral low back pain with right-sided sciatica 03/05/2016   Abnormal perimenopausal bleeding 01/09/2015    Past Medical History:  Diagnosis Date   Abnormal perimenopausal bleeding 01/09/2015   Allergic rhinitis    Anxiety    Chronic bilateral low back pain with right-sided sciatica 03/05/2016   Coronary artery disease involving native coronary artery of native heart with angina pectoris (HCC) 06/03/2016   (a) 05/2016 DES to LAD and residual 60-70% stenosis in RCA   Dyslipidemia 05/17/2016   Dyspnea, unspecified    Environmental allergies    Essential hypertension 05/17/2016   GAD (generalized anxiety disorder) 04/15/2016   GERD (gastroesophageal reflux disease)    Heartburn 01/17/2019   Hypercholesteremia    Hyperlipidemia    Hypokalemia 05/17/2016   Low back pain    Lumbago with sciatica, left side    Neuropathy    Polyosteoarthritis    Precordial pain 05/17/2016   Rheumatoid arthritis with rheumatoid factor (HCC)    SOB (shortness of breath) 05/17/2016   Tobacco abuse 05/17/2016   Unstable angina (HCC) 09/02/2021    Family History  Problem Relation Age of Onset    Breast cancer Mother    Hypertension Mother    Hypertension Father    Kidney disease Father    Heart disease Father    Past Surgical History:  Procedure Laterality Date   bilateral tubal ligation     CARDIAC CATHETERIZATION     Stent placed   CORONARY PRESSURE/FFR STUDY N/A 09/02/2021   Procedure: INTRAVASCULAR PRESSURE WIRE/FFR STUDY;  Surgeon: Lyn Records, MD;  Location: [facility];  Service: Cardiovascular;  Laterality: N/A;   CORONARY STENT INTERVENTION N/A 09/03/2021   Procedure: CORONARY STENT INTERVENTION;  Surgeon: Iran Ouch, MD;  Location: [facility];  Service: Cardiovascular;  Laterality: N/A;   LEFT HEART CATH AND CORONARY ANGIOGRAPHY N/A 09/02/2021   Procedure: LEFT HEART CATH AND CORONARY ANGIOGRAPHY;  Surgeon: Lyn Records, MD;  Location: [facility];  Service: Cardiovascular;  Laterality: N/A;   Social History   Social History Narrative   Not on file    There is no immunization history on file for this patient.   Objective: Vital Signs: BP 134/84 (BP Location: Right Arm, Patient  Position: Sitting, Cuff Size: Normal)   Pulse 70   Resp 15   Ht 5' 5.5" (1.664 m)   Wt 195 lb (88.5 kg)   BMI 31.96 kg/m    Physical Exam Eyes:     Conjunctiva/sclera: Conjunctivae normal.  Cardiovascular:     Rate and Rhythm: Normal rate and regular rhythm.  Pulmonary:     Effort: Pulmonary effort is normal.     Breath sounds: Normal breath sounds.  Musculoskeletal:     Right lower leg: No edema.     Left lower leg: No edema.  Lymphadenopathy:     Cervical: No cervical adenopathy.  Skin:    General: Skin is warm and dry.     Findings: No rash.  Neurological:     Mental Status: She is alert.  Psychiatric:        Mood and Affect: Mood normal.      Musculoskeletal Exam:  Shoulders full ROM no tenderness or swelling Elbows full ROM no tenderness or swelling Wrists full ROM no tenderness or swelling Fingers full ROM no tenderness or  swelling Low back midline and right-sided paraspinal muscle tenderness to palpation without radiation Lateral tenderness to pressure on both hips extending down over the lateral portion of both thighs, no radiation, mild pain with FADIR maneuver worse on right Right knee medial joint line tenderness to palpation, no palpable swelling Ankles full ROM no tenderness or swelling Very tender to pressure on plantar surface at calcaneus along anterior border First MTP bunions with slight lateral deviation   Investigation: No additional findings.  Imaging: XR Hand 2 View Right  Result Date: 10/29/2022 X-ray right hand 2 views Radiocarpal joint space appears normal.  Mild degenerative change of first [facility] joint with bone spurring minimal joint subluxation.  MCP joints appear normal.  Appears to be small enthesophyte or periosteal reaction at lateral border of the second proximal phalanx.  Osteoarthritis of the second and third DIP joints with small lateral osteophyte process.  No erosions or abnormal calcifications seen.  Bone mineralization appears normal. Impression Osteoarthritis of first [facility] and 2nd-3rd DIP joints  XR Hand 2 View Left  Result Date: 10/29/2022 X-ray left hand 2 views Radiocarpal joint appears normal.  Mild degenerative changes at first [facility] joint with marginal bone spurring slight subluxation.  MCP joints appear normal.  There is lateral osteophyte at second DIP.  No erosions or abnormal calcifications seen.  Bone mineralization appears normal. Impression Osteoarthritis of the first [facility] and second DIP joints   Recent Labs: Lab Results  Component Value Date   WBC 5.1 09/04/2021   HGB 14.5 09/04/2021   PLT 110 (L) 09/04/2021   NA 142 09/16/2021   K 3.8 09/16/2021   CL 105 09/16/2021   CO2 20 09/16/2021   GLUCOSE 100 (H) 09/16/2021   BUN 25 (H) 09/16/2021   CREATININE 0.84 09/16/2021   BILITOT 0.5 09/16/2021   ALKPHOS 60 09/16/2021   AST 16 09/16/2021   ALT 12 09/16/2021    PROT 6.2 09/16/2021   ALBUMIN 4.0 09/16/2021   CALCIUM 9.0 09/16/2021   GFRAA 77 01/17/2019    Speciality Comments: No specialty comments available.  Procedures:  No procedures performed Allergies: Atorvastatin, Duloxetine, Sulfamethazine, Penicillins, Statins, Morphine, and Sulfa antibiotics   Assessment / Plan:     Visit Diagnoses: Polyarthritis with positive rheumatoid factor (HCC) - Plan: XR Hand 2 View Right, XR Hand 2 View Left, Sedimentation rate, C-reactive protein, Mutated Citrullinated Vimentin (MCV) Antibody, C3 and C4  Joint pain in multiple areas with positive rheumatoid factor though no peripheral joint synovitis appreciated on exam today and with negative serum inflammatory markers.  Will recheck acute phase reactants today including complements also checking MCV antibody.  May be getting some inflammation benefit with the meloxicam as well.  Also discussed association with smoking and risk of rheumatoid arthritis and disease activity.  X-ray of bilateral hands showing osteoarthritis which seems overall currently more likely to account for the reported symptoms.  If evidence of active inflammation or additional positive antibody markers could try adding hydroxychloroquine.  Osteoarthritis of multiple joints, unspecified osteoarthritis type Chronic bilateral low back pain with right-sided sciatica  Mild hand osteoarthritis on x-rays today.  Also suspect the right knee pain is likely osteoarthritis related as well.  Lateral hips localize more to bursitis or tendinopathy type of pain I do not think it is intra-articular pathology.  Not sure how much benefit she is getting on the very low-dose gabapentin but otherwise doing pretty well with the meloxicam and oxycodone at this time.  Bilateral plantar fasciitis  Foot pain is highly consistent with plantars fasciitis not currently doing any specific range of motion exercises for this though apparently has significant bone spurring  that may be contributing to symptoms as well based on podiatry evaluation.  Provided printed plantar fasciitis exercises today.  She is continuing on the prescribed daily meloxicam.  Orders: Orders Placed This Encounter  Procedures   XR Hand 2 View Right   XR Hand 2 View Left   Sedimentation rate   C-reactive protein   Mutated Citrullinated Vimentin (MCV) Antibody   C3 and C4   No orders of the defined types were placed in this encounter.   Follow-Up Instructions: Return in about 4 weeks (around 11/26/2022) for New pt ?RF f/u 10mo.   Fuller Plan, MD  Note - This record has been created using [employer].  Chart creation errors have been sought, but may not always  have been located. Such creation errors do not reflect on  the standard of medical care.

## 2022-10-30 LAB — C3 AND C4: C4 Complement: 21 mg/dL (ref 15–57)

## 2022-11-03 LAB — C-REACTIVE PROTEIN: CRP: <3 mg/L (ref <8.0)

## 2022-11-03 LAB — SEDIMENTATION RATE: Sed Rate: 2 mm/h (ref 0–30)

## 2022-11-03 LAB — C3 AND C4: C3 Complement: 121 mg/dL (ref 83–193)

## 2022-11-03 LAB — MUTATED CITRULLINATED VIMENTIN (MCV) ANTIBODY: MUTATED CITRULLINATED VIMENTIN (MCV) AB: <20 U/mL (ref <20)

## 2022-11-30 ENCOUNTER — Ambulatory Visit: Payer: Medicaid Other | Admitting: Internal Medicine

## 2022-11-30 NOTE — Progress Notes (Deleted)
Office Visit Note  Patient: Shelby Huffman             Date of Birth: 11/29/1965           MRN: [id number]             PCP: Hortencia Conradi, NP Referring: Hortencia Conradi, NP Visit Date: 11/30/2022   Subjective:  No chief complaint on file.   History of Present Illness: Shelby Huffman is a 57 y.o. female here for follow up ***   Previous HPI 10/29/22 Shelby Huffman is a 57 y.o. female here for evaluation of chronic joint pain in multiple areas and positive rheumatoid factor.  She has pretty diffuse joint pains that have been ongoing for years he previously had evaluation with positive rheumatoid factor and referral to [facility] rheumatology in 2020 but was not able to make appointments due to her multiple obligations with work and taking care of family members at home.  Her most problematic joint pain has been chronic low back pain with some known lumbar degenerative disc disease for which she is on long-term oxycodone which is mostly beneficial.  Still experiences episodic muscle cramping type of pain previously happened more often in the abdominal region now more often at the right lower back.  Also gets pain with both hands and wrists most commonly bothers her at nighttime.  She sees some associated swelling also most often at nighttime occasionally pain radiates up to the forearm does not have prolonged morning stiffness or numbness does not get any numbness provoked with activity such as driving.  She also has chronic pain at both feet mostly bothering her at the bottom of the heels.  This is most severe first thing in the morning getting out of bed and when standing up has been told by podiatry she has significant bone spurs that may need surgery on them.  Currently on meloxicam 15 mg daily for this which is partially helpful.  Before that she was taking naproxen that was partially helpful.  Also on gabapentin 100 mg.  She was previously prescribed duloxetine with some reaction or  medication intolerance with this around 2017.  Also gets some pain at the hips and knees recently has been a lot worse with lateral hip pain extending down the outside edge of the thigh.  This gets worse with direct pressure on the area such as lying on her side. Medical history is also significant for coronary artery disease with previous stent placement in 2017 she had repeat catheterization last year for restenosis of the LAD.  She was a chronic long-term smoker of about 1-1/2 packs/day but stopped smoking for the past year.  She has had unintentional weight gain of 30 pounds compared to documented measurements 1 year ago.   Labs reviewed RF 32.9   No Rheumatology ROS completed.   PMFS History:  Patient Active Problem List   Diagnosis Date Noted   Bilateral plantar fasciitis 10/29/2022   Hyperlipidemia 04/01/2022   Unstable angina (HCC) 09/02/2021   Heartburn 01/17/2019   Allergic rhinitis    Anxiety    Environmental allergies    Hypercholesteremia    Polyosteoarthritis    Polyarthritis with positive rheumatoid factor (HCC)    Low back pain    Lumbago with sciatica, left side    Dyspnea, unspecified    Neuropathy    GERD (gastroesophageal reflux disease)    Coronary artery disease involving native coronary artery of native heart with angina pectoris (HCC) 06/03/2016  Precordial pain 05/17/2016   Dyslipidemia 05/17/2016   Essential hypertension 05/17/2016   Hypokalemia 05/17/2016   SOB (shortness of breath) 05/17/2016   Tobacco abuse 05/17/2016   GAD (generalized anxiety disorder) 04/15/2016   Chronic bilateral low back pain with right-sided sciatica 03/05/2016   Abnormal perimenopausal bleeding 01/09/2015    Past Medical History:  Diagnosis Date   Abnormal perimenopausal bleeding 01/09/2015   Allergic rhinitis    Anxiety    Chronic bilateral low back pain with right-sided sciatica 03/05/2016   Coronary artery disease involving native coronary artery of native heart with  angina pectoris (HCC) 06/03/2016   (a) 05/2016 DES to LAD and residual 60-70% stenosis in RCA   Dyslipidemia 05/17/2016   Dyspnea, unspecified    Environmental allergies    Essential hypertension 05/17/2016   GAD (generalized anxiety disorder) 04/15/2016   GERD (gastroesophageal reflux disease)    Heartburn 01/17/2019   Hypercholesteremia    Hyperlipidemia    Hypokalemia 05/17/2016   Low back pain    Lumbago with sciatica, left side    Neuropathy    Polyosteoarthritis    Precordial pain 05/17/2016   Rheumatoid arthritis with rheumatoid factor (HCC)    SOB (shortness of breath) 05/17/2016   Tobacco abuse 05/17/2016   Unstable angina (HCC) 09/02/2021    Family History  Problem Relation Age of Onset   Breast cancer Mother    Hypertension Mother    Hypertension Father    Kidney disease Father    Heart disease Father    Past Surgical History:  Procedure Laterality Date   bilateral tubal ligation     CARDIAC CATHETERIZATION     Stent placed   CORONARY PRESSURE/FFR STUDY N/A 09/02/2021   Procedure: INTRAVASCULAR PRESSURE WIRE/FFR STUDY;  Surgeon: Lyn Records, MD;  Location: [facility];  Service: Cardiovascular;  Laterality: N/A;   CORONARY STENT INTERVENTION N/A 09/03/2021   Procedure: CORONARY STENT INTERVENTION;  Surgeon: Iran Ouch, MD;  Location: [facility];  Service: Cardiovascular;  Laterality: N/A;   LEFT HEART CATH AND CORONARY ANGIOGRAPHY N/A 09/02/2021   Procedure: LEFT HEART CATH AND CORONARY ANGIOGRAPHY;  Surgeon: Lyn Records, MD;  Location: [facility];  Service: Cardiovascular;  Laterality: N/A;   Social History   Social History Narrative   Not on file    There is no immunization history on file for this patient.   Objective: Vital Signs: There were no vitals taken for this visit.   Physical Exam   Musculoskeletal Exam: ***  CDAI Exam: CDAI Score: -- Patient Global: --; Provider Global: -- Swollen: --; Tender: -- Joint Exam  11/30/2022   No joint exam has been documented for this visit   There is currently no information documented on the homunculus. Go to the Rheumatology activity and complete the homunculus joint exam.  Investigation: No additional findings.  Imaging: No results found.  Recent Labs: Lab Results  Component Value Date   WBC 5.1 09/04/2021   HGB 14.5 09/04/2021   PLT 110 (L) 09/04/2021   NA 142 09/16/2021   K 3.8 09/16/2021   CL 105 09/16/2021   CO2 20 09/16/2021   GLUCOSE 100 (H) 09/16/2021   BUN 25 (H) 09/16/2021   CREATININE 0.84 09/16/2021   BILITOT 0.5 09/16/2021   ALKPHOS 60 09/16/2021   AST 16 09/16/2021   ALT 12 09/16/2021   PROT 6.2 09/16/2021   ALBUMIN 4.0 09/16/2021   CALCIUM 9.0 09/16/2021   GFRAA 77 01/17/2019  Speciality Comments: No specialty comments available.  Procedures:  No procedures performed Allergies: Atorvastatin, Duloxetine, Sulfamethazine, Penicillins, Statins, Morphine, and Sulfa antibiotics   Assessment / Plan:     Visit Diagnoses: No diagnosis found.  ***  Orders: No orders of the defined types were placed in this encounter.  No orders of the defined types were placed in this encounter.    Follow-Up Instructions: No follow-ups on file.   Fuller Plan, MD  Note - This record has been created using [employer].  Chart creation errors have been sought, but may not always  have been located. Such creation errors do not reflect on  the standard of medical care.

## 2022-12-14 ENCOUNTER — Other Ambulatory Visit: Payer: Self-pay | Admitting: Podiatry

## 2023-01-21 ENCOUNTER — Other Ambulatory Visit: Payer: Self-pay

## 2023-01-21 DIAGNOSIS — M059 Rheumatoid arthritis with rheumatoid factor, unspecified: Secondary | ICD-10-CM | POA: Insufficient documentation

## 2023-01-22 ENCOUNTER — Other Ambulatory Visit: Payer: Self-pay | Admitting: Cardiology

## 2023-01-25 NOTE — Progress Notes (Deleted)
  Cardiology Office Note:  .   Date:  01/25/2023  ID:  Shelby Huffman, DOB [birth year], MRN [id number] PCP: Hortencia Conradi, NP  [facility] Providers Cardiologist:  Norman Herrlich, MD { Click to update primary MD,subspecialty MD or APP then REFRESH:1}   History of Present Illness: .   Shelby Huffman is a 57 y.o. female with a past medical history of CAD s/p PCI and DES to LAD in 2017; DES to RCA in 2023, hypertension, GERD, rheumatoid arthritis, dyslipidemia intolerant of statins and bempedoic acid, tobacco abuse, anxiety, depression.  06/03/2016 left heart cath severe two-vessel CAD, culprit vessel LAD, PTCA/DES x 2 the proximal to mid and mid to distal LAD 09/01/2021 echo EF 55 to 60%, mild aortic valve sclerosis, trace TR 09/03/21 coronary stent intervention distal RCA lesion 199% stenosed DES x 1, recommendation for DAPT lifelong as tolerated.  Most recently evaluated by Dr. Dulce Sellar on 04/02/2022, she had had issues tolerating bempedoic acid and statins, she was started on Repatha and Zetia.   Monotherapy with clopidogrel ROS: ***  Studies Reviewed: .        *** Risk Assessment/Calculations:   {Does this patient have ATRIAL FIBRILLATION?:[phone]} No BP recorded.  {Refresh Note OR Click here to enter BP  :1}***       Physical Exam:   VS:  There were no vitals taken for this visit.   Wt Readings from Last 3 Encounters:  10/29/22 195 lb (88.5 kg)  04/02/22 184 lb 9.6 oz (83.7 kg)  09/16/21 165 lb (74.8 kg)    GEN: Well nourished, well developed in no acute distress NECK: No JVD; No carotid bruits CARDIAC: ***RRR, no murmurs, rubs, gallops RESPIRATORY:  Clear to auscultation without rales, wheezing or rhonchi  ABDOMEN: Soft, non-tender, non-distended EXTREMITIES:  No edema; No deformity   ASSESSMENT AND PLAN: .   Coronary artery disease- s/p PCI and DES to LAD in 2017; DES to RCA in 2023 Hyperlipidemia/intolerant to statins    {Are you ordering a CV Procedure  (e.g. stress test, cath, DCCV, TEE, etc)?   Press F2        :[id number]}  Dispo: ***  Signed, Flossie Dibble, NP

## 2023-01-26 ENCOUNTER — Ambulatory Visit: Payer: Medicaid Other | Admitting: Cardiology

## 2023-02-04 NOTE — Progress Notes (Deleted)
Cardiology Office Note:  .   Date:  02/04/2023  ID:  Shelby Huffman, DOB [birth year], MRN [id number] PCP: Hortencia Conradi, NP  [facility] Providers Cardiologist:  Norman Herrlich, MD { Click to update primary MD,subspecialty MD or APP then REFRESH:1}   History of Present Illness: .   Shelby Huffman is a 57 y.o. female with a past medical history of CAD s/p PCI and DES to LAD in 2017; DES to RCA in 2023, hypertension, GERD, rheumatoid arthritis, dyslipidemia intolerant of statins and bempedoic acid, tobacco abuse, anxiety, depression.  06/03/2016 left heart cath severe two-vessel CAD, culprit vessel LAD, PTCA/DES x 2 the proximal to mid and mid to distal LAD 09/01/2021 echo EF 55 to 60%, mild aortic valve sclerosis, trace TR 09/03/21 coronary stent intervention distal RCA lesion 199% stenosed DES x 1, recommendation for DAPT lifelong as tolerated.  Most recently evaluated by Dr. Dulce Sellar on 04/02/2022, she had had issues tolerating bempedoic acid and statins, she was started on Repatha and Zetia.   Monotherapy with clopidogrel ROS: ***  Studies Reviewed: .        *** Risk Assessment/Calculations:   {Does this patient have ATRIAL FIBRILLATION?:[id number]} No BP recorded.  {Refresh Note OR Click here to enter BP  :1}***       Physical Exam:   VS:  There were no vitals taken for this visit.   Wt Readings from Last 3 Encounters:  10/29/22 195 lb (88.5 kg)  04/02/22 184 lb 9.6 oz (83.7 kg)  09/16/21 165 lb (74.8 kg)    GEN: Well nourished, well developed in no acute distress NECK: No JVD; No carotid bruits CARDIAC: ***RRR, no murmurs, rubs, gallops RESPIRATORY:  Clear to auscultation without rales, wheezing or rhonchi  ABDOMEN: Soft, non-tender, non-distended EXTREMITIES:  No edema; No deformity   ASSESSMENT AND PLAN: .   Coronary artery disease- s/p PCI and DES to LAD in 2017; DES to RCA in 2023 Hyperlipidemia/intolerant to statins    {Are you ordering a CV Procedure  (e.g. stress test, cath, DCCV, TEE, etc)?   Press F2        :[id number]}  Dispo: ***  Signed, Flossie Dibble, NP

## 2023-02-05 ENCOUNTER — Ambulatory Visit: Payer: Medicaid Other | Attending: Cardiology | Admitting: Cardiology

## 2023-02-11 NOTE — Progress Notes (Deleted)
Cardiology Office Note:  .   Date:  02/11/2023  ID:  Shelby Huffman, DOB April 27, 1966, MRN [id number] PCP: Shelby Conradi, NP   HeartCare Providers Cardiologist:  Norman Herrlich, MD { Click to update primary MD,subspecialty MD or APP then REFRESH:1}   History of Present Illness: .   Shelby Huffman is a 57 y.o. female with a past medical history of CAD s/p PCI and DES to LAD in 2017; DES to RCA in 2023, hypertension, GERD, rheumatoid arthritis, dyslipidemia intolerant of statins and bempedoic acid, tobacco abuse, anxiety, depression.  06/03/2016 left heart cath severe two-vessel CAD, culprit vessel LAD, PTCA/DES x 2 the proximal to mid and mid to distal LAD 09/01/2021 echo EF 55 to 60%, mild aortic valve sclerosis, trace TR 09/03/21 coronary stent intervention distal RCA lesion 199% stenosed DES x 1, recommendation for DAPT lifelong as tolerated.  Most recently evaluated by Dr. Dulce Sellar on 04/02/2022, she had had issues tolerating bempedoic acid and statins, she was started on Repatha and Zetia.   Monotherapy with clopidogrel ROS: ***  Studies Reviewed: .        *** Risk Assessment/Calculations:   {Does this patient have ATRIAL FIBRILLATION?:[id number]} No BP recorded.  {Refresh Note OR Click here to enter BP  :1}***       Physical Exam:   VS:  There were no vitals taken for this visit.   Wt Readings from Last 3 Encounters:  10/29/22 195 lb (88.5 kg)  04/02/22 184 lb 9.6 oz (83.7 kg)  09/16/21 165 lb (74.8 kg)    GEN: Well nourished, well developed in no acute distress NECK: No JVD; No carotid bruits CARDIAC: ***RRR, no murmurs, rubs, gallops RESPIRATORY:  Clear to auscultation without rales, wheezing or rhonchi  ABDOMEN: Soft, non-tender, non-distended EXTREMITIES:  No edema; No deformity   ASSESSMENT AND PLAN: .   Coronary artery disease- s/p PCI and DES to LAD in 2017; DES to RCA in 2023 Hyperlipidemia/intolerant to statins    {Are you ordering a CV Procedure  (e.g. stress test, cath, DCCV, TEE, etc)?   Press F2        :[id number]}  Dispo: ***  Signed, Flossie Dibble, NP

## 2023-02-12 ENCOUNTER — Ambulatory Visit: Payer: Medicaid Other | Attending: Cardiology | Admitting: Cardiology

## 2023-03-06 ENCOUNTER — Other Ambulatory Visit: Payer: Self-pay | Admitting: Cardiology

## 2023-03-06 ENCOUNTER — Other Ambulatory Visit: Payer: Self-pay | Admitting: Podiatry

## 2023-03-08 ENCOUNTER — Other Ambulatory Visit: Payer: Self-pay

## 2023-03-08 ENCOUNTER — Telehealth: Payer: Self-pay | Admitting: Cardiology

## 2023-03-08 MED ORDER — NITROGLYCERIN 0.4 MG SL SUBL: 0.4000 mg | SUBLINGUAL_TABLET | SUBLINGUAL | 1 refills | Status: AC | PRN

## 2023-03-08 NOTE — Telephone Encounter (Signed)
   Pt c/o of Chest Pain: STAT if active CP, including tightness, pressure, jaw pain, radiating pain to shoulder/upper arm/back, CP unrelieved by Nitro. Symptoms reported of SOB, nausea, vomiting, sweating.  1. Are you having CP right now? No   2. Are you experiencing any other symptoms (ex. SOB, nausea, vomiting, sweating)? No   3. Is your CP continuous or coming and going? Coming and going    4. Have you taken Nitroglycerin? No   5. How long have you been experiencing CP? Just today    6. If NO CP at time of call then end call with telling Pt to call back or call 911 if Chest pain returns prior to return call from triage team.

## 2023-03-08 NOTE — Telephone Encounter (Signed)
Called patient and she reported that today she had mid sternal sharp pain that radiated to her back for about 1 minute while she was laying in bed. Patient stated that it was the same pain that she had before when she had to get stent's.  Patient has no other symptoms except the chest pain. She also has an appointment on 9/30 with Dr. Dulce Sellar. Patient did not want to go to the ER. I explained that if she had the chest pain again that she should go to the ER to be evaluated. I also encouraged her to keep her appointment with Dr. Dulce Sellar on 9/30. Patient stated that if she had the pain again that she would go to the ER. Patient had no further questions at this time.

## 2023-03-08 NOTE — Telephone Encounter (Signed)
Medication re-filled and a detailed message was left on the patient's voice mail explaining this to the patient.

## 2023-03-08 NOTE — Telephone Encounter (Signed)
*  STAT* If patient is at the pharmacy, call can be transferred to refill team.   1. Which medications need to be refilled? (please list name of each medication and dose if known) nitroGLYCERIN (NITROSTAT) 0.4 MG SL tablet    2. Would you like to learn more about the convenience, safety, & potential cost savings by using the [facility]? No   3. Are you open to using the [facility] No   4. Which pharmacy/location (including street and city if local pharmacy) is medication to be sent to? [id number] - [place], [place] - [address]     5. Do they need a 30 day or 90 day supply? 25 Tablets

## 2023-03-09 ENCOUNTER — Ambulatory Visit: Payer: Medicaid Other | Attending: Cardiology | Admitting: Cardiology

## 2023-03-09 ENCOUNTER — Encounter: Payer: Self-pay | Admitting: Cardiology

## 2023-03-09 VITALS — BP 158/80 | HR 68 | Ht 67.0 in | Wt 205.8 lb

## 2023-03-09 DIAGNOSIS — R079 Chest pain, unspecified: Secondary | ICD-10-CM

## 2023-03-09 NOTE — Progress Notes (Signed)
Cardiology Office Note:    Date:  03/09/2023   ID:  Shelby Huffman, DOB 1966/01/10, MRN [id number]  PCP:  Hortencia Conradi, NP  Cardiologist:  Norman Herrlich, MD    Referring MD: Hortencia Conradi, NP    ASSESSMENT:    1. Chest pain of uncertain etiology    PLAN:    In order of problems listed above:  Plan as described she has a history of CAD had PCI and stent a year and a half ago we will send her directly to the ED [facility] [place] for high-sensitivity troponin if elevated admission referral to coronary angiography if normal she will be scheduled for a perfusion study in my office before she leaves continue clopidogrel along with her PCSK9 inhibitor and given a prescription for nitroglycerin and continue her beta-blocker.   Next appointment: 2 weeks   Medication Adjustments/Labs and Tests Ordered: Current medicines are reviewed at length with the patient today.  Concerns regarding medicines are outlined above.  Orders Placed This Encounter  Procedures   EKG 12-Lead   No orders of the defined types were placed in this encounter.    History of Present Illness:    Shelby Huffman is a 57 y.o. female with a hx of CAD with PCI and stent LAD in 2007 19 hypertension hyperlipidemia with statin intolerance sinus bradycardia most recently she underwent coronary angiography 09/03/2021 in the setting of acute cord syndrome with elevated troponin showing mild diffuse in-stent stenosis proximal LAD and new severe right coronary artery stenosis 90 to 95% followed by fusiform aneurysm with PCI and stent.  She was last seen 04/02/2022.  She is here with her husband quite distressed she had severe chest pain occurring at rest last evening lasted a few minutes but resolved spontaneously.  She did not have nitroglycerin at home she called the office was directed to the ED but decided to come today to speak with me.  Her husband was seen by me just prior to this visit.  Compliance with  diet, lifestyle and medications: Yes  She has had no other anginal discomfort before or after. Is not bothered by edema in her legs she takes gabapentin for what sounds like neuropathy. She has no history of heart failure As he took oral nitrates and had a severe headache Blood pressure generally is normal Not having orthopnea shortness of breath palpitation or syncope  I reviewed with her I think the best is go directly to the ED med [facility] she can be evaluated by our organization high-sensitivity troponin if elevated admission direct referral to coronary angiography if not elevated we can discharge I will have her scheduled for a perfusion study in my office before she leaves today  She will be given a prescription for sublingual nitroglycerin Past Medical History:  Diagnosis Date   Abnormal perimenopausal bleeding 01/09/2015   Allergic rhinitis    Anxiety    Bilateral plantar fasciitis 10/29/2022   Chronic bilateral low back pain with right-sided sciatica 03/05/2016   Coronary artery disease involving native coronary artery of native heart with angina pectoris (HCC) 06/03/2016   (a) 05/2016 DES to LAD and residual 60-70% stenosis in RCA   Dyslipidemia 05/17/2016   Dyspnea, unspecified    Environmental allergies    Essential hypertension 05/17/2016   GAD (generalized anxiety disorder) 04/15/2016   GERD (gastroesophageal reflux disease)    Heartburn 01/17/2019   Hypercholesteremia    Hyperlipidemia    Hypokalemia 05/17/2016   Low  back pain    Lumbago with sciatica, left side    Neuropathy    Polyarthritis with positive rheumatoid factor (HCC)    Polyosteoarthritis    Precordial pain 05/17/2016   Rheumatoid arthritis with rheumatoid factor (HCC)    SOB (shortness of breath) 05/17/2016   Tobacco abuse 05/17/2016   Unstable angina (HCC) 09/02/2021    Current Medications: Current Meds  Medication Sig   albuterol (VENTOLIN HFA) 108 (90 Base) MCG/ACT inhaler Inhale  1-2 puffs into the lungs as needed for wheezing or shortness of breath.   ASPIRIN LOW DOSE 81 MG EC tablet Take 81 mg by mouth daily.   baclofen (LIORESAL) 10 MG tablet Take 10 mg by mouth 2 (two) times daily as needed for muscle spasms.   clopidogrel (PLAVIX) 75 MG tablet Take 75 mg by mouth daily.   Evolocumab (REPATHA) 140 MG/ML SOSY Inject 140 mg into the skin every 14 (fourteen) days.   ezetimibe (ZETIA) 10 MG tablet Take 10 mg by mouth daily.   furosemide (LASIX) 40 MG tablet Take 40 mg by mouth daily.   gabapentin (NEURONTIN) 100 MG capsule Take 100 mg by mouth 2 (two) times daily.   KLOR-CON M20 20 MEQ tablet Take 20 mEq by mouth daily.   lidocaine (LIDODERM) 5 % Place 1 patch onto the skin daily.   loratadine (CLARITIN) 10 MG tablet Take 10 mg by mouth daily.   meloxicam (MOBIC) 15 MG tablet TAKE 1 TABLET (15 MG TOTAL) BY MOUTH DAILY.   metoprolol succinate (TOPROL-XL) 25 MG 24 hr tablet TAKE ONE TABLET (25 MG TOTAL) BY MOUTH DAILY.   nitroGLYCERIN (NITROSTAT) 0.4 MG SL tablet Place 1 tablet (0.4 mg total) under the tongue every 5 (five) minutes as needed for chest pain.   Oxycodone HCl 10 MG TABS Take 10 mg by mouth 4 (four) times daily as needed for pain.   predniSONE (DELTASONE) 10 MG tablet Take 10 mg by mouth daily.   Vitamin D, Ergocalciferol, (DRISDOL) 1.25 MG (50000 UT) CAPS capsule Take 50,000 Units by mouth once a week.      EKGs/Labs/Other Studies Reviewed:    The following studies were reviewed today:  Cardiac Studies & Procedures   CARDIAC CATHETERIZATION  CARDIAC CATHETERIZATION 09/03/2021  Narrative   Dist RCA-1 lesion is 99% stenosed.   Non-stenotic Dist RCA-2 lesion.   A drug-eluting stent was successfully placed using a SYNERGY XD 3.0X24.   Post intervention, there is a 0% residual stenosis.  Successful angioplasty and drug-eluting stent placement to the distal right coronary artery.  Difficult procedure overall due to severe  tortuosity.  Recommendations: Continue long-term dual antiplatelet therapy as tolerated. Aggressive treatment of risk factors. If LDL is not at target, consider treatment with a PCSK9 inhibitor given poor tolerance to potent statins.  Findings Coronary Findings Diagnostic  Dominance: Right  Right Coronary Artery There is moderate diffuse disease throughout the vessel. Dist RCA-1 lesion is 99% stenosed. The lesion is type C. The lesion was not previously treated . Non-stenotic Dist RCA-2 lesion.  Right Ventricular Branch Vessel is small in size.  Right Posterior Descending Artery  Intervention  Dist RCA-1 lesion Stent Lesion length:  20 mm. CATH LAUNCHER 6FR AL.75 guide catheter was inserted. Lesion crossed with guidewire using a WIRE RUNTHROUGH .014X300CM. Pre-stent angioplasty was performed using a BALLN SAPPHIRE 2.75X15. Maximum pressure:  10 atm. Inflation time:  30 sec. A drug-eluting stent was successfully placed using a SYNERGY XD 3.0X24. Maximum pressure: 14 atm. Inflation time:  20 sec. Post-stent angioplasty was performed using a BALLN [place] EUPHORA RX 3.5X15. Maximum pressure:  14 atm. Inflation time:  20 sec. The procedure was difficult overall due to severe tortuosity of the vessel.  I had to use a long wire with an over-the-wire of the balloon in order to be able to cross the very tight stenosis.  Given severe tortuosity, multiple pseudo lesions were noted in the vessel which impacted the flow with PCI and made it difficult to visualize the lesion. Post-Intervention Lesion Assessment The intervention was successful. Pre-interventional TIMI flow is 2. Post-intervention TIMI flow is 3. No complications occurred at this lesion. There is a 0% residual stenosis post intervention.   CARDIAC CATHETERIZATION 09/02/2021  Narrative CONCLUSIONS: Moderate diffuse in-stent restenosis in the proximal LAD stent obstructing the vessel by up to 60%.  RFR 0.93. Widely patent left  main Widely patent large circumflex Right coronary is the culprit.  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  Competitive flow in the PDA suggesting LAD septal perforator collaterals are present. Inferobasal hypokinesis.  Mid anterior wall hypokinesis.  EF 50%.  LVEDP 22 mmHg. RECOMMENDATIONS:  Single-vessel coronary bypass versus high risk PCI with option for emergency surgery if vessel closure occurs.  There would also be option to treat the right coronary medically rather than with emergency surgery if the patient is tolerating occlusion in a stable manner. Plan continue dual antiplatelet therapy that the patient has been on leading to this hospital stay. Discussed with other members of the interventional team.  Have already discussed with Herbie Baltimore and Tresa Endo.  Findings Coronary Findings Diagnostic  Dominance: Right  Left Anterior Descending Prox LAD lesion is 50% stenosed. The lesion was previously treated . Mid LAD-1 lesion is 65% stenosed. The lesion was previously treated . Mid LAD-2 lesion is 30% stenosed. Mid LAD to Dist LAD lesion is 10% stenosed. The lesion was previously treated .  Left Circumflex  First Obtuse Marginal Branch Vessel is large in size.  Right Coronary Artery There is moderate diffuse disease throughout the vessel. Dist RCA-1 lesion is 99% stenosed. Non-stenotic Dist RCA-2 lesion.  Right Ventricular Branch Vessel is small in size.  Right Posterior Descending Artery Collaterals RPDA filled by collaterals from 2nd Sept.  Intervention  No interventions have been documented.   STRESS TESTS  MYOCARDIAL PERFUSION IMAGING 02/22/2019  Narrative  The left ventricular ejection fraction is normal (55-65%).  Nuclear stress EF: 64%.  There was no ST segment deviation noted during stress.  This is a low risk study.  No evidenc eof ischemia  Normal EF.   ECHOCARDIOGRAM  ECHOCARDIOGRAM  COMPLETE 09/03/2021  Narrative ECHOCARDIOGRAM REPORT    Patient Name:   NATSHA MCKEOWN Date of Exam: 09/03/2021 Medical Rec #:  [id number]       Height:       67.0 in Accession #:    [id number]      Weight:       160.0 lb Date of Birth:  1966-05-04       BSA:          1.839 m Patient Age:    55 years        BP:           130/71 mmHg Patient Gender: F               HR:           59 bpm. Exam Location:  Inpatient  Procedure: 2D Echo, Cardiac Doppler and Color Doppler  Indications:    R07.9 CHEST PAIN  History:        Patient has no prior history of Echocardiogram examinations. CAD, Signs/Symptoms:Dyspnea and Shortness of Breath; Risk Factors:Hypertension and Dyslipidemia. TOBACCO ABUSE.  Sonographer:    Festus Barren Referring Phys: [id number] MUHAMMAD A ARIDA  IMPRESSIONS   1. Left ventricular ejection fraction, by estimation, is 55 to 60%. The left ventricle has normal function. Left ventricular endocardial border not optimally defined to evaluate regional wall motion. Left ventricular diastolic parameters were normal. 2. Right ventricular systolic function is normal. The right ventricular size is normal. Tricuspid regurgitation signal is inadequate for assessing PA pressure. 3. The mitral valve is grossly normal. No evidence of mitral valve regurgitation. No evidence of mitral stenosis. 4. The aortic valve is tricuspid. Aortic valve regurgitation is trivial. No aortic stenosis is present. 5. The inferior vena cava is normal in size with greater than 50% respiratory variability, suggesting right atrial pressure of 3 mmHg.  FINDINGS Left Ventricle: Left ventricular ejection fraction, by estimation, is 55 to 60%. The left ventricle has normal function. Left ventricular endocardial border not optimally defined to evaluate regional wall motion. The left ventricular internal cavity size was normal in size. There is no left ventricular hypertrophy. Left ventricular diastolic parameters were  normal.  Right Ventricle: The right ventricular size is normal. No increase in right ventricular wall thickness. Right ventricular systolic function is normal. Tricuspid regurgitation signal is inadequate for assessing PA pressure.  Left Atrium: Left atrial size was normal in size.  Right Atrium: Right atrial size was normal in size.  Pericardium: There is no evidence of pericardial effusion.  Mitral Valve: The mitral valve is grossly normal. No evidence of mitral valve regurgitation. No evidence of mitral valve stenosis.  Tricuspid Valve: The tricuspid valve is grossly normal. Tricuspid valve regurgitation is not demonstrated. No evidence of tricuspid stenosis.  Aortic Valve: The aortic valve is tricuspid. Aortic valve regurgitation is trivial. No aortic stenosis is present. Aortic valve mean gradient measures 3.0 mmHg. Aortic valve peak gradient measures 5.6 mmHg. Aortic valve area, by VTI measures 2.48 cm.  Pulmonic Valve: The pulmonic valve was grossly normal. Pulmonic valve regurgitation is not visualized. No evidence of pulmonic stenosis.  Aorta: The aortic root and ascending aorta are structurally normal, with no evidence of dilitation.  Venous: The inferior vena cava is normal in size with greater than 50% respiratory variability, suggesting right atrial pressure of 3 mmHg.  IAS/Shunts: The atrial septum is grossly normal.   LEFT VENTRICLE PLAX 2D LVIDd:         4.70 cm     Diastology LVIDs:         3.00 cm     LV e' medial:    12.90 cm/s LV PW:         1.00 cm     LV E/e' medial:  5.0 LV IVS:        0.90 cm     LV e' lateral:   9.25 cm/s LVOT diam:     2.20 cm     LV E/e' lateral: 7.0 LV SV:         64 LV SV Index:   35 LVOT Area:     3.80 cm  LV Volumes (MOD) LV vol d, MOD A2C: 55.0 ml LV vol d, MOD A4C: 58.5 ml LV vol s, MOD A2C: 24.3 ml LV vol s, MOD A4C: 22.9 ml LV  SV MOD A2C:     30.7 ml LV SV MOD A4C:     58.5 ml LV SV MOD BP:      32.5 ml  RIGHT  VENTRICLE             IVC RV S prime:     13.90 cm/s  IVC diam: 1.80 cm RVOT diam:      2.20 cm TAPSE (M-mode): 1.8 cm  LEFT ATRIUM             Index        RIGHT ATRIUM           Index LA diam:        2.50 cm 1.36 cm/m   RA Area:     13.30 cm LA Vol (A2C):   43.6 ml 23.70 ml/m  RA Volume:   34.00 ml  18.49 ml/m LA Vol (A4C):   24.1 ml 13.10 ml/m LA Biplane Vol: 33.9 ml 18.43 ml/m AORTIC VALVE                    PULMONIC VALVE AV Area (Vmax):    2.50 cm     PV Vmax:       0.53 m/s AV Area (Vmean):   2.40 cm     PV Vmean:      31.300 cm/s AV Area (VTI):     2.48 cm     PV VTI:        0.121 m AV Vmax:           118.00 cm/s  PV Peak grad:  1.1 mmHg AV Vmean:          77.400 cm/s  PV Mean grad:  0.0 mmHg AV VTI:            0.258 m AV Peak Grad:      5.6 mmHg AV Mean Grad:      3.0 mmHg LVOT Vmax:         77.60 cm/s LVOT Vmean:        48.800 cm/s LVOT VTI:          0.168 m LVOT/AV VTI ratio: 0.65  AORTA Ao Root diam: 2.80 cm Ao Asc diam:  2.70 cm  MITRAL VALVE MV Area (PHT): 2.29 cm    SHUNTS MV Decel Time: 331 msec    Systemic VTI:  0.17 m MV E velocity: 65.10 cm/s  Systemic Diam: 2.20 cm MV A velocity: 51.00 cm/s  Pulmonic Diam: 2.20 cm MV E/A ratio:  1.28  Lennie Odor MD Electronically signed by Lennie Odor MD Signature Date/Time: 09/03/2021/3:52:12 PM    Final             EKG Interpretation Date/Time:  Tuesday March 09 2023 09:37:17 EDT Ventricular Rate:  68 PR Interval:  142 QRS Duration:  78 QT Interval:  420 QTC Calculation: 446 R Axis:   72  Text Interpretation: Normal sinus rhythm nonspecific T waves ECG OTHERWISE WITHIN NORMAL LIMITS When compared with ECG of 04-Sep-2021 05:28, No significant change was found Confirmed by Norman Herrlich ([id number]) on 03/09/2023 9:40:44 AM   Recent Labs: No results found for requested labs within last 365 days.  Recent Lipid Panel    Component Value Date/Time   CHOL 159 09/16/2021 1353   TRIG 63 09/16/2021  1353   HDL 35 (L) 09/16/2021 1353   CHOLHDL 4.5 (H) 09/16/2021 1353   CHOLHDL 5.2 09/04/2021 0732   VLDL 15 09/04/2021 0732  LDLCALC 111 (H) 09/16/2021 1353    Physical Exam:    VS:  BP (!) 158/80   Pulse 68   Ht 5\' 7"  (1.702 m)   Wt 205 lb 12.8 oz (93.4 kg)   SpO2 93%   BMI 32.23 kg/m     Wt Readings from Last 3 Encounters:  03/09/23 205 lb 12.8 oz (93.4 kg)  10/29/22 195 lb (88.5 kg)  04/02/22 184 lb 9.6 oz (83.7 kg)     GEN:  Well nourished, well developed in no acute distress HEENT: Normal NECK: No JVD; No carotid bruits LYMPHATICS: No lymphadenopathy CARDIAC: RRR, no murmurs, rubs, gallops RESPIRATORY:  Clear to auscultation without rales, wheezing or rhonchi  ABDOMEN: Soft, non-tender, non-distended MUSCULOSKELETAL:  No edema; No deformity  SKIN: Warm and dry NEUROLOGIC:  Alert and oriented x 3 PSYCHIATRIC:  Normal affect    Signed, Norman Herrlich, MD  03/09/2023 9:41 AM    [facility]

## 2023-03-09 NOTE — Patient Instructions (Signed)
Medication Instructions:  Your physician recommends that you continue on your current medications as directed. Please refer to the Current Medication list given to you today.  *If you need a refill on your cardiac medications before your next appointment, please call your pharmacy*   Lab Work: None If you have labs (blood work) drawn today and your tests are completely normal, you will receive your results only by: MyChart Message (if you have MyChart) OR A paper copy in the mail If you have any lab test that is abnormal or we need to change your treatment, we will call you to review the results.   Testing/Procedures:   [facility] [address] [place], [place] [address] Phone:  [phone]    Please arrive 15 minutes prior to your appointment time for registration and insurance purposes.  The test will take approximately 3 to 4 hours to complete; you may bring reading material.  If someone comes with you to your appointment, they will need to remain in the main lobby due to limited space in the testing area. **If you are pregnant or breastfeeding, please notify the nuclear lab prior to your appointment**  How to prepare for your Myocardial Perfusion Test: Do not eat or drink 3 hours prior to your test, except you may have water. Do not consume products containing caffeine (regular or decaffeinated) 12 hours prior to your test. (ex: coffee, chocolate, sodas, tea). Do bring a list of your current medications with you.  If not listed below, you may take your medications as normal. Do wear comfortable clothes (no dresses or overalls) and walking shoes, tennis shoes preferred (No heels or open toe shoes are allowed). Do NOT wear cologne, perfume, aftershave, or lotions (deodorant is allowed). If these instructions are not followed, your test will have to be rescheduled.  Please report to [address] for your test.  If you have questions or  concerns about your appointment, you can call the [facility] Lab at [phone].  If you cannot keep your appointment, please provide 24 hours notification to the Nuclear Lab, to avoid a possible $50 charge to your account.    Follow-Up: At [facility], you and your health needs are our priority.  As part of our continuing mission to provide you with exceptional heart care, we have created designated Provider Care Teams.  These Care Teams include your primary Cardiologist (physician) and Advanced Practice Providers (APPs -  Physician Assistants and Nurse Practitioners) who all work together to provide you with the care you need, when you need it.  We recommend signing up for the patient portal called "MyChart".  Sign up information is provided on this After Visit Summary.  MyChart is used to connect with patients for Virtual Visits (Telemedicine).  Patients are able to view lab/test results, encounter notes, upcoming appointments, etc.  Non-urgent messages can be sent to your provider as well.   To learn more about what you can do with MyChart, go to [email or web address].    Your next appointment:   2 week(s)  Provider:   Norman Herrlich, MD    Other Instructions None

## 2023-03-11 ENCOUNTER — Ambulatory Visit: Payer: Medicaid Other

## 2023-03-15 ENCOUNTER — Ambulatory Visit: Payer: Medicaid Other | Admitting: Cardiology

## 2023-03-23 ENCOUNTER — Ambulatory Visit: Payer: Medicaid Other

## 2023-03-23 ENCOUNTER — Ambulatory Visit: Payer: Medicaid Other | Admitting: Cardiology

## 2023-03-25 ENCOUNTER — Ambulatory Visit: Payer: Medicaid Other

## 2023-03-29 ENCOUNTER — Ambulatory Visit (INDEPENDENT_AMBULATORY_CARE_PROVIDER_SITE_OTHER): Payer: Medicaid Other | Admitting: Podiatry

## 2023-03-29 DIAGNOSIS — Z91199 Patient's noncompliance with other medical treatment and regimen due to unspecified reason: Secondary | ICD-10-CM

## 2023-03-29 NOTE — Progress Notes (Signed)
Patient absent for apointment

## 2023-04-01 NOTE — Progress Notes (Deleted)
Cardiology Office Note:    Date:  04/05/2023   ID:  Amarea Fox, DOB [birth year], MRN [id number]  PCP:  Hortencia Conradi, NP  Cardiologist:  Norman Herrlich, MD    Referring MD: Hortencia Conradi, NP    ASSESSMENT:    1. Coronary artery disease involving native coronary artery of native heart with angina pectoris (HCC)   2. Essential hypertension   3. Hypercholesteremia   4. Statin intolerance    PLAN:    In order of problems listed above:  ***   Next appointment: ***   Medication Adjustments/Labs and Tests Ordered: Current medicines are reviewed at length with the patient today.  Concerns regarding medicines are outlined above.  No orders of the defined types were placed in this encounter.  No orders of the defined types were placed in this encounter.    History of Present Illness:    Shelby Huffman is a 57 y.o. female with a hx of CAD with PCI and stent to left anterior descending coronary artery in 2019 and right coronary artery March 2023 hypertension hyperlipidemia statin intolerance and sinus bradycardia last seen 03/09/2023.  Compliance with diet, lifestyle and medications: *** Past Medical History:  Diagnosis Date   Abnormal perimenopausal bleeding 01/09/2015   Allergic rhinitis    Anxiety    Bilateral plantar fasciitis 10/29/2022   Chronic bilateral low back pain with right-sided sciatica 03/05/2016   Coronary artery disease involving native coronary artery of native heart with angina pectoris (HCC) 06/03/2016   (a) 05/2016 DES to LAD and residual 60-70% stenosis in RCA   Dyslipidemia 05/17/2016   Dyspnea, unspecified    Environmental allergies    Essential hypertension 05/17/2016   GAD (generalized anxiety disorder) 04/15/2016   GERD (gastroesophageal reflux disease)    Heartburn 01/17/2019   Hypercholesteremia    Hyperlipidemia    Hypokalemia 05/17/2016   Low back pain    Lumbago with sciatica, left side    Neuropathy    Polyarthritis with  positive rheumatoid factor (HCC)    Polyosteoarthritis    Precordial pain 05/17/2016   Rheumatoid arthritis with rheumatoid factor (HCC)    SOB (shortness of breath) 05/17/2016   Tobacco abuse 05/17/2016   Unstable angina (HCC) 09/02/2021    Current Medications: No outpatient medications have been marked as taking for the 04/05/23 encounter (Appointment) with Baldo Daub, MD.      EKGs/Labs/Other Studies Reviewed:    The following studies were reviewed today:  Cardiac Studies & Procedures   CARDIAC CATHETERIZATION  CARDIAC CATHETERIZATION 09/03/2021  Narrative   Dist RCA-1 lesion is 99% stenosed.   Non-stenotic Dist RCA-2 lesion.   A drug-eluting stent was successfully placed using a SYNERGY XD 3.0X24.   Post intervention, there is a 0% residual stenosis.  Successful angioplasty and drug-eluting stent placement to the distal right coronary artery.  Difficult procedure overall due to severe tortuosity.  Recommendations: Continue long-term dual antiplatelet therapy as tolerated. Aggressive treatment of risk factors. If LDL is not at target, consider treatment with a PCSK9 inhibitor given poor tolerance to potent statins.  Findings Coronary Findings Diagnostic  Dominance: Right  Right Coronary Artery There is moderate diffuse disease throughout the vessel. Dist RCA-1 lesion is 99% stenosed. The lesion is type C. The lesion was not previously treated . Non-stenotic Dist RCA-2 lesion.  Right Ventricular Branch Vessel is small in size.  Right Posterior Descending Artery  Intervention  Dist RCA-1 lesion Stent Lesion length:  20 mm. CATH LAUNCHER [id number]  Cardiology Office Note:    Date:  04/05/2023   ID:  Amarea Fox, DOB [birth year], MRN [id number]  PCP:  Hortencia Conradi, NP  Cardiologist:  Norman Herrlich, MD    Referring MD: Hortencia Conradi, NP    ASSESSMENT:    1. Coronary artery disease involving native coronary artery of native heart with angina pectoris (HCC)   2. Essential hypertension   3. Hypercholesteremia   4. Statin intolerance    PLAN:    In order of problems listed above:  ***   Next appointment: ***   Medication Adjustments/Labs and Tests Ordered: Current medicines are reviewed at length with the patient today.  Concerns regarding medicines are outlined above.  No orders of the defined types were placed in this encounter.  No orders of the defined types were placed in this encounter.    History of Present Illness:    Shelby Huffman is a 57 y.o. female with a hx of CAD with PCI and stent to left anterior descending coronary artery in 2019 and right coronary artery March 2023 hypertension hyperlipidemia statin intolerance and sinus bradycardia last seen 03/09/2023.  Compliance with diet, lifestyle and medications: *** Past Medical History:  Diagnosis Date   Abnormal perimenopausal bleeding 01/09/2015   Allergic rhinitis    Anxiety    Bilateral plantar fasciitis 10/29/2022   Chronic bilateral low back pain with right-sided sciatica 03/05/2016   Coronary artery disease involving native coronary artery of native heart with angina pectoris (HCC) 06/03/2016   (a) 05/2016 DES to LAD and residual 60-70% stenosis in RCA   Dyslipidemia 05/17/2016   Dyspnea, unspecified    Environmental allergies    Essential hypertension 05/17/2016   GAD (generalized anxiety disorder) 04/15/2016   GERD (gastroesophageal reflux disease)    Heartburn 01/17/2019   Hypercholesteremia    Hyperlipidemia    Hypokalemia 05/17/2016   Low back pain    Lumbago with sciatica, left side    Neuropathy    Polyarthritis with  positive rheumatoid factor (HCC)    Polyosteoarthritis    Precordial pain 05/17/2016   Rheumatoid arthritis with rheumatoid factor (HCC)    SOB (shortness of breath) 05/17/2016   Tobacco abuse 05/17/2016   Unstable angina (HCC) 09/02/2021    Current Medications: No outpatient medications have been marked as taking for the 04/05/23 encounter (Appointment) with Baldo Daub, MD.      EKGs/Labs/Other Studies Reviewed:    The following studies were reviewed today:  Cardiac Studies & Procedures   CARDIAC CATHETERIZATION  CARDIAC CATHETERIZATION 09/03/2021  Narrative   Dist RCA-1 lesion is 99% stenosed.   Non-stenotic Dist RCA-2 lesion.   A drug-eluting stent was successfully placed using a SYNERGY XD 3.0X24.   Post intervention, there is a 0% residual stenosis.  Successful angioplasty and drug-eluting stent placement to the distal right coronary artery.  Difficult procedure overall due to severe tortuosity.  Recommendations: Continue long-term dual antiplatelet therapy as tolerated. Aggressive treatment of risk factors. If LDL is not at target, consider treatment with a PCSK9 inhibitor given poor tolerance to potent statins.  Findings Coronary Findings Diagnostic  Dominance: Right  Right Coronary Artery There is moderate diffuse disease throughout the vessel. Dist RCA-1 lesion is 99% stenosed. The lesion is type C. The lesion was not previously treated . Non-stenotic Dist RCA-2 lesion.  Right Ventricular Branch Vessel is small in size.  Right Posterior Descending Artery  Intervention  Dist RCA-1 lesion Stent Lesion length:  20 mm. CATH LAUNCHER [id number]  Cardiology Office Note:    Date:  04/05/2023   ID:  Amarea Fox, DOB [birth year], MRN [id number]  PCP:  Hortencia Conradi, NP  Cardiologist:  Norman Herrlich, MD    Referring MD: Hortencia Conradi, NP    ASSESSMENT:    1. Coronary artery disease involving native coronary artery of native heart with angina pectoris (HCC)   2. Essential hypertension   3. Hypercholesteremia   4. Statin intolerance    PLAN:    In order of problems listed above:  ***   Next appointment: ***   Medication Adjustments/Labs and Tests Ordered: Current medicines are reviewed at length with the patient today.  Concerns regarding medicines are outlined above.  No orders of the defined types were placed in this encounter.  No orders of the defined types were placed in this encounter.    History of Present Illness:    Shelby Huffman is a 57 y.o. female with a hx of CAD with PCI and stent to left anterior descending coronary artery in 2019 and right coronary artery March 2023 hypertension hyperlipidemia statin intolerance and sinus bradycardia last seen 03/09/2023.  Compliance with diet, lifestyle and medications: *** Past Medical History:  Diagnosis Date   Abnormal perimenopausal bleeding 01/09/2015   Allergic rhinitis    Anxiety    Bilateral plantar fasciitis 10/29/2022   Chronic bilateral low back pain with right-sided sciatica 03/05/2016   Coronary artery disease involving native coronary artery of native heart with angina pectoris (HCC) 06/03/2016   (a) 05/2016 DES to LAD and residual 60-70% stenosis in RCA   Dyslipidemia 05/17/2016   Dyspnea, unspecified    Environmental allergies    Essential hypertension 05/17/2016   GAD (generalized anxiety disorder) 04/15/2016   GERD (gastroesophageal reflux disease)    Heartburn 01/17/2019   Hypercholesteremia    Hyperlipidemia    Hypokalemia 05/17/2016   Low back pain    Lumbago with sciatica, left side    Neuropathy    Polyarthritis with  positive rheumatoid factor (HCC)    Polyosteoarthritis    Precordial pain 05/17/2016   Rheumatoid arthritis with rheumatoid factor (HCC)    SOB (shortness of breath) 05/17/2016   Tobacco abuse 05/17/2016   Unstable angina (HCC) 09/02/2021    Current Medications: No outpatient medications have been marked as taking for the 04/05/23 encounter (Appointment) with Baldo Daub, MD.      EKGs/Labs/Other Studies Reviewed:    The following studies were reviewed today:  Cardiac Studies & Procedures   CARDIAC CATHETERIZATION  CARDIAC CATHETERIZATION 09/03/2021  Narrative   Dist RCA-1 lesion is 99% stenosed.   Non-stenotic Dist RCA-2 lesion.   A drug-eluting stent was successfully placed using a SYNERGY XD 3.0X24.   Post intervention, there is a 0% residual stenosis.  Successful angioplasty and drug-eluting stent placement to the distal right coronary artery.  Difficult procedure overall due to severe tortuosity.  Recommendations: Continue long-term dual antiplatelet therapy as tolerated. Aggressive treatment of risk factors. If LDL is not at target, consider treatment with a PCSK9 inhibitor given poor tolerance to potent statins.  Findings Coronary Findings Diagnostic  Dominance: Right  Right Coronary Artery There is moderate diffuse disease throughout the vessel. Dist RCA-1 lesion is 99% stenosed. The lesion is type C. The lesion was not previously treated . Non-stenotic Dist RCA-2 lesion.  Right Ventricular Branch Vessel is small in size.  Right Posterior Descending Artery  Intervention  Dist RCA-1 lesion Stent Lesion length:  20 mm. CATH LAUNCHER [id number]  AL.75 guide catheter was inserted. Lesion crossed with guidewire using a WIRE RUNTHROUGH .014X300CM. Pre-stent angioplasty was performed using a BALLN SAPPHIRE 2.75X15. Maximum pressure:  10 atm. Inflation time:  30 sec. A drug-eluting stent was successfully placed using a SYNERGY XD 3.0X24. Maximum pressure: 14 atm.  Inflation time: 20 sec. Post-stent angioplasty was performed using a BALLN [place] EUPHORA RX 3.5X15. Maximum pressure:  14 atm. Inflation time:  20 sec. The procedure was difficult overall due to severe tortuosity of the vessel.  I had to use a long wire with an over-the-wire of the balloon in order to be able to cross the very tight stenosis.  Given severe tortuosity, multiple pseudo lesions were noted in the vessel which impacted the flow with PCI and made it difficult to visualize the lesion. Post-Intervention Lesion Assessment The intervention was successful. Pre-interventional TIMI flow is 2. Post-intervention TIMI flow is 3. No complications occurred at this lesion. There is a 0% residual stenosis post intervention.   CARDIAC CATHETERIZATION 09/02/2021  Narrative CONCLUSIONS: Moderate diffuse in-stent restenosis in the proximal LAD stent obstructing the vessel by up to 60%.  RFR 0.93. Widely patent left main Widely patent large circumflex Right coronary is the culprit.  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  Competitive flow in the PDA suggesting LAD septal perforator collaterals are present. Inferobasal hypokinesis.  Mid anterior wall hypokinesis.  EF 50%.  LVEDP 22 mmHg. RECOMMENDATIONS:  Single-vessel coronary bypass versus high risk PCI with option for emergency surgery if vessel closure occurs.  There would also be option to treat the right coronary medically rather than with emergency surgery if the patient is tolerating occlusion in a stable manner. Plan continue dual antiplatelet therapy that the patient has been on leading to this hospital stay. Discussed with other members of the interventional team.  Have already discussed with Herbie Baltimore and Tresa Endo.  Findings Coronary Findings Diagnostic  Dominance: Right  Left Anterior Descending Prox LAD lesion is 50% stenosed. The lesion was previously treated . Mid LAD-1 lesion is  65% stenosed. The lesion was previously treated . Mid LAD-2 lesion is 30% stenosed. Mid LAD to Dist LAD lesion is 10% stenosed. The lesion was previously treated .  Left Circumflex  First Obtuse Marginal Branch Vessel is large in size.  Right Coronary Artery There is moderate diffuse disease throughout the vessel. Dist RCA-1 lesion is 99% stenosed. Non-stenotic Dist RCA-2 lesion.  Right Ventricular Branch Vessel is small in size.  Right Posterior Descending Artery Collaterals RPDA filled by collaterals from 2nd Sept.  Intervention  No interventions have been documented.   STRESS TESTS  MYOCARDIAL PERFUSION IMAGING 02/22/2019  Narrative  The left ventricular ejection fraction is normal (55-65%).  Nuclear stress EF: 64%.  There was no ST segment deviation noted during stress.  This is a low risk study.  No evidenc eof ischemia  Normal EF.   ECHOCARDIOGRAM  ECHOCARDIOGRAM COMPLETE 09/03/2021  Narrative ECHOCARDIOGRAM REPORT    Patient Name:   KATRYN BABY Date of Exam: 09/03/2021 Medical Rec #:  [id number]       Height:       67.0 in Accession #:    [id number]      Weight:       160.0 lb Date of Birth:  12-08-1965       BSA:          1.839 m Patient Age:    23 years  AL.75 guide catheter was inserted. Lesion crossed with guidewire using a WIRE RUNTHROUGH .014X300CM. Pre-stent angioplasty was performed using a BALLN SAPPHIRE 2.75X15. Maximum pressure:  10 atm. Inflation time:  30 sec. A drug-eluting stent was successfully placed using a SYNERGY XD 3.0X24. Maximum pressure: 14 atm.  Inflation time: 20 sec. Post-stent angioplasty was performed using a BALLN [place] EUPHORA RX 3.5X15. Maximum pressure:  14 atm. Inflation time:  20 sec. The procedure was difficult overall due to severe tortuosity of the vessel.  I had to use a long wire with an over-the-wire of the balloon in order to be able to cross the very tight stenosis.  Given severe tortuosity, multiple pseudo lesions were noted in the vessel which impacted the flow with PCI and made it difficult to visualize the lesion. Post-Intervention Lesion Assessment The intervention was successful. Pre-interventional TIMI flow is 2. Post-intervention TIMI flow is 3. No complications occurred at this lesion. There is a 0% residual stenosis post intervention.   CARDIAC CATHETERIZATION 09/02/2021  Narrative CONCLUSIONS: Moderate diffuse in-stent restenosis in the proximal LAD stent obstructing the vessel by up to 60%.  RFR 0.93. Widely patent left main Widely patent large circumflex Right coronary is the culprit.  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  Competitive flow in the PDA suggesting LAD septal perforator collaterals are present. Inferobasal hypokinesis.  Mid anterior wall hypokinesis.  EF 50%.  LVEDP 22 mmHg. RECOMMENDATIONS:  Single-vessel coronary bypass versus high risk PCI with option for emergency surgery if vessel closure occurs.  There would also be option to treat the right coronary medically rather than with emergency surgery if the patient is tolerating occlusion in a stable manner. Plan continue dual antiplatelet therapy that the patient has been on leading to this hospital stay. Discussed with other members of the interventional team.  Have already discussed with Herbie Baltimore and Tresa Endo.  Findings Coronary Findings Diagnostic  Dominance: Right  Left Anterior Descending Prox LAD lesion is 50% stenosed. The lesion was previously treated . Mid LAD-1 lesion is  65% stenosed. The lesion was previously treated . Mid LAD-2 lesion is 30% stenosed. Mid LAD to Dist LAD lesion is 10% stenosed. The lesion was previously treated .  Left Circumflex  First Obtuse Marginal Branch Vessel is large in size.  Right Coronary Artery There is moderate diffuse disease throughout the vessel. Dist RCA-1 lesion is 99% stenosed. Non-stenotic Dist RCA-2 lesion.  Right Ventricular Branch Vessel is small in size.  Right Posterior Descending Artery Collaterals RPDA filled by collaterals from 2nd Sept.  Intervention  No interventions have been documented.   STRESS TESTS  MYOCARDIAL PERFUSION IMAGING 02/22/2019  Narrative  The left ventricular ejection fraction is normal (55-65%).  Nuclear stress EF: 64%.  There was no ST segment deviation noted during stress.  This is a low risk study.  No evidenc eof ischemia  Normal EF.   ECHOCARDIOGRAM  ECHOCARDIOGRAM COMPLETE 09/03/2021  Narrative ECHOCARDIOGRAM REPORT    Patient Name:   KATRYN BABY Date of Exam: 09/03/2021 Medical Rec #:  [id number]       Height:       67.0 in Accession #:    [id number]      Weight:       160.0 lb Date of Birth:  12-08-1965       BSA:          1.839 m Patient Age:    23 years

## 2023-04-05 ENCOUNTER — Ambulatory Visit: Payer: Medicaid Other | Attending: Cardiology | Admitting: Cardiology

## 2023-04-12 ENCOUNTER — Encounter: Payer: Self-pay | Admitting: Cardiology

## 2023-04-25 ENCOUNTER — Other Ambulatory Visit: Payer: Self-pay | Admitting: Cardiology

## 2023-05-14 ENCOUNTER — Other Ambulatory Visit: Payer: Self-pay | Admitting: Podiatry

## 2023-05-31 ENCOUNTER — Ambulatory Visit: Payer: Medicaid Other | Admitting: Podiatry

## 2023-06-01 ENCOUNTER — Ambulatory Visit: Payer: Medicaid Other | Admitting: Podiatry

## 2023-06-05 ENCOUNTER — Other Ambulatory Visit: Payer: Self-pay | Admitting: Cardiology

## 2023-06-14 ENCOUNTER — Telehealth: Payer: Self-pay | Admitting: Cardiology

## 2023-06-14 ENCOUNTER — Ambulatory Visit: Payer: Medicaid Other | Admitting: Podiatry

## 2023-06-14 NOTE — Telephone Encounter (Signed)
Pt c/o medication issue:  1. Name of Medication:   Evolocumab (REPATHA) 140 MG/ML SOSY    2. How are you currently taking this medication (dosage and times per day)? INJECT 140 MG INTO THE SKIN EVERY 14 (FOURTEEN) DAYS.   3. Are you having a reaction (difficulty breathing--STAT)? No  4. What is your medication issue?  Pt states that insurance will not cover medication. She would like to know if se can be changed. Please advise

## 2023-06-17 ENCOUNTER — Telehealth: Payer: Self-pay | Admitting: Pharmacy Technician

## 2023-06-17 ENCOUNTER — Other Ambulatory Visit (HOSPITAL_COMMUNITY): Payer: Self-pay

## 2023-06-17 NOTE — Telephone Encounter (Signed)
 Pt states that she had labs done yesterday at her PCP. Labs have been requested

## 2023-06-17 NOTE — Telephone Encounter (Signed)
 Patient called to follow up on getting her  Evolocumab (REPATHA) 140 MG/ML SOSY medication.  Patient stated her insurance will not approve this medication and wants to get alternate medication or put her back on Nexlitol.

## 2023-06-17 NOTE — Telephone Encounter (Signed)
 Shelby Huffman with [employer] is following up. She states PA for Repatha  will be denied because they need an updated lipid panel. She states the labs they received were from 2023, which does not support PA. She states PA may be resubmitted with updated labs. Per [name], they do not have a call back #, but the # she called in on was [phone].

## 2023-06-17 NOTE — Telephone Encounter (Signed)
 Pharmacy Patient Advocate Encounter  Received notification from [facility] Medicaid that Prior Authorization for repatha  has been DENIED.  Full denial letter will be uploaded to the media tab. See denial reason below. Need updated labs  PA #/Case ID/Reference #: [id number]

## 2023-06-17 NOTE — Telephone Encounter (Signed)
 Pharmacy Patient Advocate Encounter   Received notification from Pt Calls Messages that prior authorization for repatha  is required/requested.   Insurance verification completed.   The patient is insured through [facility] [place] .   Per test claim: PA required; PA submitted to above mentioned insurance via CoverMyMeds Key/confirmation #/EOC [id number] Status is pending

## 2023-06-21 ENCOUNTER — Telehealth: Payer: Self-pay | Admitting: Pharmacy Technician

## 2023-06-21 ENCOUNTER — Other Ambulatory Visit (HOSPITAL_COMMUNITY): Payer: Self-pay

## 2023-06-21 NOTE — Telephone Encounter (Addendum)
 Pharmacy Patient Advocate Encounter   Received notification from Pt Calls Messages that prior authorization for repatha  is required/requested.   Insurance verification completed.   The patient is insured through [facility] [place] .   Per test claim: PA required; PA submitted to above mentioned insurance via Fax Key/confirmation #/EOC [id number] Status is pending

## 2023-06-22 ENCOUNTER — Other Ambulatory Visit (HOSPITAL_COMMUNITY): Payer: Self-pay

## 2023-06-23 ENCOUNTER — Other Ambulatory Visit (HOSPITAL_COMMUNITY): Payer: Self-pay

## 2023-06-23 NOTE — Telephone Encounter (Signed)
 Pt is aware that PA has been approved.

## 2023-06-23 NOTE — Telephone Encounter (Addendum)
 Pharmacy Patient Advocate Encounter  Received notification from [facility] Medicaid that Prior Authorization for repatha has been APPROVED from 06/17/23 to 06/21/24   PA #/Case ID/Reference #: [id number]    RX must be for Repatha [employer]click

## 2023-06-28 ENCOUNTER — Telehealth: Payer: Self-pay | Admitting: Cardiology

## 2023-06-28 ENCOUNTER — Other Ambulatory Visit: Payer: Self-pay

## 2023-06-28 DIAGNOSIS — E7800 Pure hypercholesterolemia, unspecified: Secondary | ICD-10-CM

## 2023-06-28 MED ORDER — NEXLETOL 180 MG PO TABS: 180.0000 mg | ORAL_TABLET | Freq: Every day | ORAL | 3 refills | Status: DC

## 2023-06-28 NOTE — Telephone Encounter (Signed)
 Pt c/o medication issue:  1. Name of Medication: Evolocumab  (REPATHA ) 140 MG/ML SOSY   2. How are you currently taking this medication (dosage and times per day)? Not taking currently  3. Are you having a reaction (difficulty breathing--STAT)? yes  4. What is your medication issue? Medication was approved through insurance but she states that she thinks this medication was giving her sores in her mouth and causing weight gain. She wants to know if she can switch to Nexeltol. Please advise. P cv div

## 2023-06-28 NOTE — Telephone Encounter (Signed)
 Called patient and informed her of Dr. Leandrew recommendation below:  Good idea I am in agreement lets place her on Nexletol  1 daily 2 months we will check a lipid profile.  Patient verbalized understanding and had no further questions at this time. Nexletol  was ordered via Epic and sent to her pharmacy. Labs were ordered via Epic.

## 2023-06-28 NOTE — Telephone Encounter (Signed)
 Called patient and she stated that when she takes her Repatha medication she develops sores in her mouth and gains weight. She would like to switch back to [facility] if possible. Please advise.

## 2023-06-29 ENCOUNTER — Other Ambulatory Visit: Payer: Self-pay

## 2023-07-05 ENCOUNTER — Other Ambulatory Visit: Payer: Self-pay

## 2023-07-05 ENCOUNTER — Ambulatory Visit: Payer: Medicaid Other | Admitting: Podiatry

## 2023-07-05 ENCOUNTER — Other Ambulatory Visit (HOSPITAL_COMMUNITY): Payer: Self-pay

## 2023-07-05 ENCOUNTER — Telehealth: Payer: Self-pay | Admitting: Pharmacy Technician

## 2023-07-05 NOTE — Telephone Encounter (Signed)
Pt is calling back checking on the status of medication

## 2023-07-05 NOTE — Telephone Encounter (Signed)
Sent over chart notes

## 2023-07-05 NOTE — Telephone Encounter (Signed)
Pharmacy Patient Advocate Encounter   Received notification from Pt Calls Messages that prior authorization for nexletol is required/requested.   Insurance verification completed.   The patient is insured through [facility] [place] .   Per test claim: PA required; PA submitted to above mentioned insurance via CoverMyMeds Key/confirmation #/EOC [id number] Status is pending

## 2023-07-06 ENCOUNTER — Other Ambulatory Visit (HOSPITAL_COMMUNITY): Payer: Self-pay

## 2023-07-06 NOTE — Telephone Encounter (Signed)
Patient is returning call.  °

## 2023-07-06 NOTE — Telephone Encounter (Signed)
Left message for the patient to call back.

## 2023-07-06 NOTE — Telephone Encounter (Signed)
Informed patient her prior authorization for Nexletol was approved.  She thanked me for the call and had no additional questions.

## 2023-07-06 NOTE — Telephone Encounter (Signed)
Pharmacy Patient Advocate Encounter  Received notification from [facility] Medicaid that Prior Authorization for nexletol has been APPROVED from 07/05/23 to 07/05/24   PA #/Case ID/Reference #: [id number]

## 2023-07-12 ENCOUNTER — Other Ambulatory Visit: Payer: Self-pay | Admitting: Podiatry

## 2023-07-15 ENCOUNTER — Telehealth: Payer: Self-pay | Admitting: *Deleted

## 2023-07-15 NOTE — Telephone Encounter (Signed)
Patient called, Dr. Clelia Croft with [facility] suggested patient needs to be prescribed methotrexate due to joint pain. Please advise.

## 2023-07-19 NOTE — Progress Notes (Signed)
 Office Visit Note  Patient: Shelby Huffman             Date of Birth: 1966-01-31           MRN: [id number]             PCP: Zachary Lamar BRAVO, NP Referring: Zachary Lamar BRAVO, NP Visit Date: 07/20/2023   Subjective:  Follow-up (Patient states the numbness in her hands is worse. Patient states her fingers and knees are locking up. Patient states her feet and ankles are stiff when she walks. Patient states she likes to stay moving. Patient states her other doctor wants her to be on methotrexate and that is why he recommended she come back here. )   Discussed the use of AI scribe software for clinical note transcription with the patient, who gave verbal consent to proceed.  History of Present Illness   Shelby Huffman is a 58 year old female who presents with worsening joint pain and stiffness concern for rheumatoid arthritis with previous positive rheumatoid factor evaluated last year..  She experiences worsening joint pain and stiffness, particularly in her hands and feet. Stiffness is most pronounced in the mornings, with her middle finger often locking and requiring manual manipulation to release. Swelling is noted in her hands and ankles, more pronounced at night. She has a history of a positive rheumatoid factor and previous x-rays showing wear and tear damage on her hands. Significant pain is reported in her feet, especially in her ankles when walking, leading to a desire to walk without bending due to the pain.  She describes numbness in her hands, with the left hand being more affected than the right. The numbness extends up to her neck, causing a 'weird feeling'. She has not been diagnosed with carpal tunnel syndrome but experiences symptoms consistent with it, such as numbness and pain in her hands, particularly at night.  She reports pain on the bottom of her feet, which she describes as plantar fasciitis, causing significant discomfort when walking, especially first thing in the  morning. She has been doing exercises such as stretching on stairs and rolling her feet on a frozen can, which she finds helpful.  She is currently taking Mobic  (meloxicam ) for inflammation. She has previously tried Voltaren cream without relief.  No recent major illnesses such as COVID-19 or flu. She is concerned about the potential side effects of medications, particularly those affecting liver function, as she is already taking multiple medications, including oxycodone  for pain.    Previous HPI 10/29/22 Shelby Huffman is a 58 y.o. female here for evaluation of chronic joint pain in multiple areas and positive rheumatoid factor.  She has pretty diffuse joint pains that have been ongoing for years he previously had evaluation with positive rheumatoid factor and referral to [facility] rheumatology in 2020 but was not able to make appointments due to her multiple obligations with work and taking care of family members at home.  Her most problematic joint pain has been chronic low back pain with some known lumbar degenerative disc disease for which she is on long-term oxycodone  which is mostly beneficial.  Still experiences episodic muscle cramping type of pain previously happened more often in the abdominal region now more often at the right lower back.  Also gets pain with both hands and wrists most commonly bothers her at nighttime.  She sees some associated swelling also most often at nighttime occasionally pain radiates up to the forearm does not have prolonged morning stiffness  or numbness does not get any numbness provoked with activity such as driving.  She also has chronic pain at both feet mostly bothering her at the bottom of the heels.  This is most severe first thing in the morning getting out of bed and when standing up has been told by podiatry she has significant bone spurs that may need surgery on them.  Currently on meloxicam  15 mg daily for this which is partially helpful.  Before that she  was taking naproxen that was partially helpful.  Also on gabapentin 100 mg.  She was previously prescribed duloxetine with some reaction or medication intolerance with this around 2017.  Also gets some pain at the hips and knees recently has been a lot worse with lateral hip pain extending down the outside edge of the thigh.  This gets worse with direct pressure on the area such as lying on her side. Medical history is also significant for coronary artery disease with previous stent placement in 2017 she had repeat catheterization last year for restenosis of the LAD.  She was a chronic long-term smoker of about 1-1/2 packs/day but stopped smoking for the past year.  She has had unintentional weight gain of 30 pounds compared to documented measurements 1 year ago.   Labs reviewed RF 32.9   Review of Systems  Constitutional:  Positive for fatigue.  HENT:  Positive for mouth sores. Negative for mouth dryness.   Eyes:  Negative for dryness.  Respiratory:  Positive for shortness of breath.   Cardiovascular:  Positive for palpitations. Negative for chest pain.  Gastrointestinal:  Positive for constipation. Negative for blood in stool and diarrhea.  Endocrine: Negative for increased urination.  Genitourinary:  Negative for involuntary urination.  Musculoskeletal:  Positive for joint pain, joint pain, joint swelling, myalgias, muscle weakness, morning stiffness, muscle tenderness and myalgias. Negative for gait problem.  Skin:  Negative for color change, rash, hair loss and sensitivity to sunlight.  Allergic/Immunologic: Negative for susceptible to infections.  Neurological:  Negative for dizziness and headaches.  Hematological:  Negative for swollen glands.  Psychiatric/Behavioral:  Positive for sleep disturbance. Negative for depressed mood. The patient is nervous/anxious.     PMFS History:  Patient Active Problem List   Diagnosis Date Noted   High risk medication use 07/20/2023   Rheumatoid  arthritis with rheumatoid factor (HCC)    Bilateral plantar fasciitis 10/29/2022   Hyperlipidemia 04/01/2022   Unstable angina (HCC) 09/02/2021   Heartburn 01/17/2019   Allergic rhinitis    Anxiety    Environmental allergies    Hypercholesteremia    Polyosteoarthritis    Polyarthritis with positive rheumatoid factor (HCC)    Low back pain    Lumbago with sciatica, left side    Dyspnea, unspecified    Neuropathy    GERD (gastroesophageal reflux disease)    Coronary artery disease involving native coronary artery of native heart with angina pectoris (HCC) 06/03/2016   Precordial pain 05/17/2016   Dyslipidemia 05/17/2016   Essential hypertension 05/17/2016   Hypokalemia 05/17/2016   SOB (shortness of breath) 05/17/2016   Tobacco abuse 05/17/2016   GAD (generalized anxiety disorder) 04/15/2016   Chronic bilateral low back pain with right-sided sciatica 03/05/2016   Abnormal perimenopausal bleeding 01/09/2015    Past Medical History:  Diagnosis Date   Abnormal perimenopausal bleeding 01/09/2015   Allergic rhinitis    Anxiety    Bilateral plantar fasciitis 10/29/2022   Chronic bilateral low back pain with right-sided sciatica 03/05/2016  Coronary artery disease involving native coronary artery of native heart with angina pectoris (HCC) 06/03/2016   (a) 05/2016 DES to LAD and residual 60-70% stenosis in RCA   Dyslipidemia 05/17/2016   Dyspnea, unspecified    Environmental allergies    Essential hypertension 05/17/2016   GAD (generalized anxiety disorder) 04/15/2016   GERD (gastroesophageal reflux disease)    Heartburn 01/17/2019   Hypercholesteremia    Hyperlipidemia    Hypokalemia 05/17/2016   Low back pain    Lumbago with sciatica, left side    Neuropathy    Polyarthritis with positive rheumatoid factor (HCC)    Polyosteoarthritis    Precordial pain 05/17/2016   Rheumatoid arthritis with rheumatoid factor (HCC)    SOB (shortness of breath) 05/17/2016   Tobacco abuse  05/17/2016   Unstable angina (HCC) 09/02/2021    Family History  Problem Relation Age of Onset   Breast cancer Mother    Hypertension Mother    Hypertension Father    Kidney disease Father    Heart disease Father    Past Surgical History:  Procedure Laterality Date   bilateral tubal ligation     CARDIAC CATHETERIZATION     Stent placed   CORONARY PRESSURE/FFR STUDY N/A 09/02/2021   Procedure: INTRAVASCULAR PRESSURE WIRE/FFR STUDY;  Surgeon: Claudene Victory ORN, MD;  Location: [facility];  Service: Cardiovascular;  Laterality: N/A;   CORONARY STENT INTERVENTION N/A 09/03/2021   Procedure: CORONARY STENT INTERVENTION;  Surgeon: Darron Deatrice LABOR, MD;  Location: [facility];  Service: Cardiovascular;  Laterality: N/A;   LEFT HEART CATH AND CORONARY ANGIOGRAPHY N/A 09/02/2021   Procedure: LEFT HEART CATH AND CORONARY ANGIOGRAPHY;  Surgeon: Claudene Victory ORN, MD;  Location: [facility];  Service: Cardiovascular;  Laterality: N/A;   Social History   Social History Narrative   Not on file    There is no immunization history on file for this patient.   Objective: Vital Signs: BP 114/74 (BP Location: Left Arm, Patient Position: Sitting, Cuff Size: Large)   Pulse 66   Resp 14   Ht 5' 7 (1.702 m)   Wt 214 lb (97.1 kg)   BMI 33.52 kg/m    Physical Exam Cardiovascular:     Rate and Rhythm: Normal rate and regular rhythm.  Pulmonary:     Effort: Pulmonary effort is normal.     Breath sounds: Normal breath sounds.  Skin:    General: Skin is warm and dry.  Neurological:     Mental Status: She is alert.  Psychiatric:        Mood and Affect: Mood normal.      Musculoskeletal Exam:  Neck full ROM no tenderness Shoulders full ROM no tenderness or swelling Elbows full ROM no tenderness or swelling Wrists full ROM no tenderness or swelling Fingers full ROM, right 3rd DIP nodules, left 3rd-4th fingers triggering Knees full ROM no tenderness or swelling Tenderness to  pressure on soles of both feet worst near anterior border of calcaneus    Investigation: No additional findings.  Imaging: No results found.  Recent Labs: Lab Results  Component Value Date   WBC 4.0 07/20/2023   HGB 14.2 07/20/2023   PLT 178 07/20/2023   NA 141 07/20/2023   K 4.6 07/20/2023   CL 104 07/20/2023   CO2 28 07/20/2023   GLUCOSE 104 (H) 07/20/2023   BUN 24 07/20/2023   CREATININE 1.39 (H) 07/20/2023   BILITOT 0.5 07/20/2023   ALKPHOS 60  09/16/2021   AST 24 07/20/2023   ALT 17 07/20/2023   PROT 6.5 07/20/2023   ALBUMIN 4.0 09/16/2021   CALCIUM 9.3 07/20/2023   GFRAA 77 01/17/2019    Speciality Comments: No specialty comments available.  Procedures:  No procedures performed Allergies: Atorvastatin, Duloxetine, Sulfamethazine, Penicillins, Statins, Morphine, and Sulfa antibiotics   Assessment / Plan:     Visit Diagnoses: Polyarthritis with positive rheumatoid factor (HCC) - Plan: Sedimentation rate, C-reactive protein, Rheumatoid factor, predniSONE  (DELTASONE ) 5 MG tablet Positive rheumatoid factor with joint pain, stiffness, and numbness. X-rays showed wear and tear damage on the hands. Patient reports locking of fingers, particularly on the left hand. Swelling in hands and ankles, more pronounced at night. Currently on Mobic  for pain management. -Draw blood for further testing to confirm active inflammation causing pain, vs OA/CTS related. -Consider starting Methotrexate if tests are positive. -Consider a short-term steroid taper to alleviate symptoms.  High risk medication use - Plan: Hepatitis B surface antigen, Hepatitis B core antibody, IgM, Hepatitis C antibody, CBC with Differential/Platelet, COMPLETE METABOLIC PANEL WITH GFR Checking baseline labs considering new methotrexate start for suspected RA inflammation. No history of liver disease or chronic infection. Does not drink.  Carpal Tunnel Syndrome Numbness in hands, particularly the left,  extending up the arm. Pain localized to the wrist. -If inflammatory tests are negative, refer for nerve conduction study to confirm diagnosis. -Consider injections or referral to hand surgeon based on severity.  Plantar Fasciitis Pain in feet, particularly upon walking. Bone spurs noted on the ankle. -Provide handout with exercises for plantar fasciitis.  Trigger Finger Locking of fingers, particularly on the left hand. Explained to patient that this is due to inflammation on the tendons often more from use. Not severely painful or restricting use at this time.   Orders: Orders Placed This Encounter  Procedures   Sedimentation rate   C-reactive protein   Rheumatoid factor   Hepatitis B surface antigen   Hepatitis B core antibody, IgM   Hepatitis C antibody   CBC with Differential/Platelet   COMPLETE METABOLIC PANEL WITH GFR   Meds ordered this encounter  Medications   predniSONE  (DELTASONE ) 5 MG tablet    Sig: Take 3 tablets (15 mg total) by mouth daily with breakfast for 3 days, THEN 2 tablets (10 mg total) daily with breakfast for 3 days, THEN 1 tablet (5 mg total) daily with breakfast for 3 days.    Dispense:  18 tablet    Refill:  0     Follow-Up Instructions: No follow-ups on file.   Lonni LELON Ester, MD  Note - This record has been created using [employer].  Chart creation errors have been sought, but may not always  have been located. Such creation errors do not reflect on  the standard of medical care.

## 2023-07-20 ENCOUNTER — Encounter: Payer: Self-pay | Admitting: Internal Medicine

## 2023-07-20 ENCOUNTER — Ambulatory Visit: Payer: Medicaid Other | Attending: Internal Medicine | Admitting: Internal Medicine

## 2023-07-20 VITALS — BP 114/74 | HR 66 | Resp 14 | Ht 67.0 in | Wt 214.0 lb

## 2023-07-20 DIAGNOSIS — M0580 Other rheumatoid arthritis with rheumatoid factor of unspecified site: Secondary | ICD-10-CM | POA: Diagnosis not present

## 2023-07-20 DIAGNOSIS — G629 Polyneuropathy, unspecified: Secondary | ICD-10-CM

## 2023-07-20 DIAGNOSIS — Z79899 Other long term (current) drug therapy: Secondary | ICD-10-CM

## 2023-07-20 DIAGNOSIS — M159 Polyosteoarthritis, unspecified: Secondary | ICD-10-CM

## 2023-07-20 MED ORDER — PREDNISONE 5 MG PO TABS: ORAL_TABLET | ORAL | 0 refills | Status: AC

## 2023-07-20 NOTE — Patient Instructions (Signed)
 Methotrexate  Tablets What is this medication? METHOTREXATE  (METH oh TREX ate) treats autoimmune conditions, such as arthritis and psoriasis. It works by decreasing inflammation, which can reduce pain and prevent long-term injury to the joints and skin. It may also be used to treat some types of cancer. It works by slowing down the growth of cancer cells. This medicine may be used for other purposes; ask your health care provider or pharmacist if you have questions. COMMON BRAND NAME(S): Rheumatrex, Trexall  What should I tell my care team before I take this medication? They need to know if you have any of these conditions: Dehydration Diabetes Fluid in the stomach area or lungs Frequently drink alcohol Having surgery, including dental surgery High cholesterol Immune system problems Inflammatory bowel disease, such as ulcerative colitis Kidney disease Liver disease Low blood cell levels (white cells, red cells, and platelets) Lung disease Recent or ongoing radiation Recent or upcoming vaccine Stomach ulcers, other stomach or intestine problems An unusual or allergic reaction to methotrexate , other medications, foods, dyes, or preservatives Pregnant or trying to get pregnant Breastfeeding How should I use this medication? Take this medication by mouth with water. Take it as directed on the prescription label. Do not take extra. Keep taking this medication until your care team tells you to stop. Know why you are taking this medication and how you should take it. To treat conditions such as arthritis and psoriasis, this medication is taken ONCE A WEEK as a single dose or divided into 3 smaller doses taken 12 hours apart (do not take more than 3 doses 12 hours apart each week). This medication is NEVER taken daily to treat conditions other than cancer. Taking this medication more often than directed can cause serious side effects, even death. Talk to your care team about why you are taking this  medication, how often you will take it, and what your dose is. Ask your care team to put the reason you take this medication on the prescription. If you take this medication ONCE A WEEK, choose a day of the week before you start. Ask your pharmacist to include the day of the week on the label. Avoid Monday, which could be misread as Morning. Handling this medication may be harmful. Talk to your care team about how to handle this medication. Special instructions may apply. Talk to your care team about the use of this medication in children. While it may be prescribed for selected conditions, precautions do apply. Overdosage: If you think you have taken too much of this medicine contact a poison control center or emergency room at once. NOTE: This medicine is only for you. Do not share this medicine with others. What if I miss a dose? If you miss a dose, talk with your care team. Do not take double or extra doses. What may interact with this medication? Do not take this medication with any of the following: Acitretin Live virus vaccines Probenecid This medication may also interact with the following: Alcohol Aspirin and aspirin-like medications Certain antibiotics, such as penicillin, neomycin, sulfamethoxazole; trimethoprim Certain medications for stomach problems, such as lansoprazole, omeprazole, pantoprazole Clozapine Cyclosporine Dapsone Folic acid  Foscarnet NSAIDs, medications for pain and inflammation, such as ibuprofen or naproxen Phenytoin Pyrimethamine Steroid medications, such as prednisone  or cortisone Tacrolimus Theophylline This list may not describe all possible interactions. Give your health care provider a list of all the medicines, herbs, non-prescription drugs, or dietary supplements you use. Also tell them if you smoke, drink alcohol, or use  illegal drugs. Some items may interact with your medicine. What should I watch for while using this medication? Visit your  care team for regular checks on your progress. It may be some time before you see the benefit from this medication. You may need blood work done while you are taking this medication. If your care team has also prescribed folic acid , they may instruct you to skip your folic acid  dose on the day you take methotrexate . This medication can make you more sensitive to the sun. Keep out of the sun. If you cannot avoid being in the sun, wear protective clothing and sunscreen. Do not use sun lamps, tanning beds, or tanning booths. Check with your care team if you have severe diarrhea, nausea, and vomiting, or if you sweat a lot. The loss of too much body fluid may make it dangerous for you to take this medication. This medication may increase your risk of getting an infection. Call your care team for advice if you get a fever, chills, sore throat, or other symptoms of a cold or flu. Do not treat yourself. Try to avoid being around people who are sick. Talk to your care team about your risk of cancer. You may be more at risk for certain types of cancers if you take this medication. Talk to your care team if you or your partner may be pregnant. Serious birth defects can occur if you take this medication during pregnancy and for 6 months after the last dose. You will need a negative pregnancy test before starting this medication. Contraception is recommended while taking this medication and for 6 months after the last dose. Your care team can help you find the option that works for you. If your partner can get pregnant, use a condom during sex while taking this medication and for 3 months after the last dose. Do not breastfeed while taking this medication and for 1 week after the last dose. This medication may cause infertility. Talk to your care team if you are concerned about your fertility. What side effects may I notice from receiving this medication? Side effects that you should report to your care team as soon  as possible: Allergic reactions--skin rash, itching, hives, swelling of the face, lips, tongue, or throat Dry cough, shortness of breath or trouble breathing Infection--fever, chills, cough, sore throat, wounds that don't heal, pain or trouble when passing urine, general feeling of discomfort or being unwell Kidney injury--decrease in the amount of urine, swelling of the ankles, hands, or feet Liver injury--right upper belly pain, loss of appetite, nausea, light-colored stool, dark yellow or brown urine, yellowing skin or eyes, unusual weakness or fatigue Low red blood cell level--unusual weakness or fatigue, dizziness, headache, trouble breathing Pain, tingling, or numbness in the hands or feet, muscle weakness, change in vision, confusion or trouble speaking, loss of balance or coordination, trouble walking, seizures Redness, blistering, peeling, or loosening of the skin, including inside the mouth Stomach bleeding--bloody or black, tar-like stools, vomiting blood or brown material that looks like coffee grounds Stomach pain that is severe, does not go away, or gets worse Unusual bruising or bleeding Side effects that usually do not require medical attention (report these to your care team if they continue or are bothersome): Diarrhea Dizziness Hair loss Nausea Pain, redness, or swelling with sores inside the mouth or throat Skin reactions on sun-exposed areas Vomiting This list may not describe all possible side effects. Call your doctor for medical advice about side effects.  You may report side effects to FDA at 1-800-FDA-1088. Where should I keep my medication? Keep out of the reach of children and pets. Store at room temperature between 20 and 25 degrees C (68 and 77 degrees F). Protect from light. Keep the container tightly closed. Get rid of any unused medication after the expiration date. To get rid of medications that are no longer needed or have expired: Take the medication to a  medication take-back program. Check with your pharmacy or law enforcement to find a location. If you cannot return the medication, ask your pharmacist or care team how to get rid of this medication safely. NOTE: This sheet is a summary. It may not cover all possible information. If you have questions about this medicine, talk to your doctor, pharmacist, or health care provider.  2024 Elsevier/Gold Standard (2023-05-14 00:00:00)

## 2023-07-21 ENCOUNTER — Telehealth: Payer: Self-pay

## 2023-07-21 DIAGNOSIS — Z79899 Other long term (current) drug therapy: Secondary | ICD-10-CM

## 2023-07-21 NOTE — Telephone Encounter (Signed)
-----   Message from Lonni ORN [facility] sent at 07/21/2023  8:09 AM EST ----- I am still waiting for a few of the results. But I was surprised to see her metabolic panel test indicates a decrease in kidney function. This can be affected by NSAIDs for arthritis.  I recommend she stop taking any of the mobic  as well as ibuprofen or aleve temporarily and drink plenty of water. We could recheck the basic metabolic panel again after  a week or two. The prednisone  should help for inflammation in the meanwhile.

## 2023-07-21 NOTE — Progress Notes (Signed)
 I am still waiting for a few of the results. But I was surprised to see her metabolic panel test indicates a decrease in kidney function. This can be affected by NSAIDs for arthritis.  I recommend she stop taking any of the mobic  as well as ibuprofen or aleve temporarily and drink plenty of water. We could recheck the basic metabolic panel again after  a week or two. The prednisone  should help for inflammation in the meanwhile.

## 2023-07-22 LAB — CBC WITH DIFFERENTIAL/PLATELET
Absolute Lymphocytes: 1740 {cells}/uL (ref 850–3900)
Absolute Monocytes: 352 {cells}/uL (ref 200–950)
Basophils Absolute: 20 {cells}/uL (ref 0–200)
Basophils Relative: 0.5 %
Eosinophils Absolute: 132 {cells}/uL (ref 15–500)
Eosinophils Relative: 3.3 %
HCT: 42.2 % (ref 35.0–45.0)
Hemoglobin: 14.2 g/dL (ref 11.7–15.5)
MCH: 30.4 pg (ref 27.0–33.0)
MCHC: 33.6 g/dL (ref 32.0–36.0)
MCV: 90.4 fL (ref 80.0–100.0)
MPV: 10.6 fL (ref 7.5–12.5)
Monocytes Relative: 8.8 %
Neutro Abs: 1756 {cells}/uL (ref 1500–7800)
Neutrophils Relative %: 43.9 %
Platelets: 178 10*3/uL (ref 140–400)
RBC: 4.67 10*6/uL (ref 3.80–5.10)
RDW: 13 % (ref 11.0–15.0)
Total Lymphocyte: 43.5 %
WBC: 4 10*3/uL (ref 3.8–10.8)

## 2023-07-22 LAB — COMPLETE METABOLIC PANEL WITH GFR
AG Ratio: 1.8 (calc) (ref 1.0–2.5)
ALT: 17 U/L (ref 6–29)
AST: 24 U/L (ref 10–35)
Albumin: 4.2 g/dL (ref 3.6–5.1)
Alkaline phosphatase (APISO): 66 U/L (ref 37–153)
BUN/Creatinine Ratio: 17 (calc) (ref 6–22)
BUN: 24 mg/dL (ref 7–25)
CO2: 28 mmol/L (ref 20–32)
Calcium: 9.3 mg/dL (ref 8.6–10.4)
Chloride: 104 mmol/L (ref 98–110)
Creat: 1.39 mg/dL — ABNORMAL HIGH (ref 0.50–1.03)
Globulin: 2.3 g/dL (ref 1.9–3.7)
Glucose, Bld: 104 mg/dL — ABNORMAL HIGH (ref 65–99)
Potassium: 4.6 mmol/L (ref 3.5–5.3)
Sodium: 141 mmol/L (ref 135–146)
Total Bilirubin: 0.5 mg/dL (ref 0.2–1.2)
Total Protein: 6.5 g/dL (ref 6.1–8.1)
eGFR: 44 mL/min/{1.73_m2} — ABNORMAL LOW (ref >=60)

## 2023-07-22 LAB — HEPATITIS B SURFACE ANTIGEN: Hepatitis B Surface Ag: NONREACTIVE

## 2023-07-22 LAB — HEPATITIS C ANTIBODY: Hepatitis C Ab: NONREACTIVE

## 2023-07-22 LAB — RHEUMATOID FACTOR: Rheumatoid fact SerPl-aCnc: 76 [IU]/mL — ABNORMAL HIGH (ref <14)

## 2023-07-22 LAB — C-REACTIVE PROTEIN: CRP: <3 mg/L (ref <8.0)

## 2023-07-22 LAB — SEDIMENTATION RATE: Sed Rate: 9 mm/h (ref 0–30)

## 2023-07-22 LAB — HEPATITIS B CORE ANTIBODY, IGM: Hep B C IgM: NONREACTIVE

## 2023-07-27 ENCOUNTER — Ambulatory Visit: Payer: Medicaid Other | Admitting: Podiatry

## 2023-08-10 ENCOUNTER — Ambulatory Visit (INDEPENDENT_AMBULATORY_CARE_PROVIDER_SITE_OTHER): Payer: Medicaid Other | Admitting: Podiatry

## 2023-08-10 DIAGNOSIS — Z91199 Patient's noncompliance with other medical treatment and regimen due to unspecified reason: Secondary | ICD-10-CM

## 2023-08-17 ENCOUNTER — Ambulatory Visit (INDEPENDENT_AMBULATORY_CARE_PROVIDER_SITE_OTHER): Payer: Medicaid Other | Admitting: Podiatry

## 2023-08-17 DIAGNOSIS — Z91199 Patient's noncompliance with other medical treatment and regimen due to unspecified reason: Secondary | ICD-10-CM

## 2023-08-17 NOTE — Progress Notes (Signed)
 Patient rescheduled within 24 hours of appointment

## 2023-08-20 NOTE — Progress Notes (Signed)
 Patient absent from appointment

## 2023-08-26 NOTE — Progress Notes (Deleted)
 Office Visit Note  Patient: Shelby Huffman             Date of Birth: 08-Apr-1966           MRN: [id number]             PCP: Hortencia Conradi, NP Referring: Hortencia Conradi, NP Visit Date: 08/27/2023   Subjective:  No chief complaint on file.   History of Present Illness: Shelby Huffman is a 58 y.o. female here for follow up ***   Previous HPI 07/20/23 Shelby Huffman is a 58 year old female who presents with worsening joint pain and stiffness concern for rheumatoid arthritis with previous positive rheumatoid factor evaluated last year..   She experiences worsening joint pain and stiffness, particularly in her hands and feet. Stiffness is most pronounced in the mornings, with her middle finger often locking and requiring manual manipulation to release. Swelling is noted in her hands and ankles, more pronounced at night. She has a history of a positive rheumatoid factor and previous x-rays showing wear and tear damage on her hands. Significant pain is reported in her feet, especially in her ankles when walking, leading to a desire to walk without bending due to the pain.   She describes numbness in her hands, with the left hand being more affected than the right. The numbness extends up to her neck, causing a 'weird feeling'. She has not been diagnosed with carpal tunnel syndrome but experiences symptoms consistent with it, such as numbness and pain in her hands, particularly at night.   She reports pain on the bottom of her feet, which she describes as plantar fasciitis, causing significant discomfort when walking, especially first thing in the morning. She has been doing exercises such as stretching on stairs and rolling her feet on a frozen can, which she finds helpful.   She is currently taking Mobic (meloxicam) for inflammation. She has previously tried Voltaren cream without relief.   No recent major illnesses such as COVID-19 or flu. She is concerned about the potential side  effects of medications, particularly those affecting liver function, as she is already taking multiple medications, including oxycodone for pain.      Previous HPI 10/29/22 Shelby Huffman is a 58 y.o. female here for evaluation of chronic joint pain in multiple areas and positive rheumatoid factor.  She has pretty diffuse joint pains that have been ongoing for years he previously had evaluation with positive rheumatoid factor and referral to [facility] rheumatology in 2020 but was not able to make appointments due to her multiple obligations with work and taking care of family members at home.  Her most problematic joint pain has been chronic low back pain with some known lumbar degenerative disc disease for which she is on long-term oxycodone which is mostly beneficial.  Still experiences episodic muscle cramping type of pain previously happened more often in the abdominal region now more often at the right lower back.  Also gets pain with both hands and wrists most commonly bothers her at nighttime.  She sees some associated swelling also most often at nighttime occasionally pain radiates up to the forearm does not have prolonged morning stiffness or numbness does not get any numbness provoked with activity such as driving.  She also has chronic pain at both feet mostly bothering her at the bottom of the heels.  This is most severe first thing in the morning getting out of bed and when standing up has been told by  podiatry she has significant bone spurs that may need surgery on them.  Currently on meloxicam 15 mg daily for this which is partially helpful.  Before that she was taking naproxen that was partially helpful.  Also on gabapentin 100 mg.  She was previously prescribed duloxetine with some reaction or medication intolerance with this around 2017.  Also gets some pain at the hips and knees recently has been a lot worse with lateral hip pain extending down the outside edge of the thigh.  This gets  worse with direct pressure on the area such as lying on her side. Medical history is also significant for coronary artery disease with previous stent placement in 2017 she had repeat catheterization last year for restenosis of the LAD.  She was a chronic long-term smoker of about 1-1/2 packs/day but stopped smoking for the past year.  She has had unintentional weight gain of 30 pounds compared to documented measurements 1 year ago.   Labs reviewed RF 32.9   No Rheumatology ROS completed.   PMFS History:  Patient Active Problem List   Diagnosis Date Noted   High risk medication use 07/20/2023   Rheumatoid arthritis with rheumatoid factor (HCC)    Bilateral plantar fasciitis 10/29/2022   Hyperlipidemia 04/01/2022   Unstable angina (HCC) 09/02/2021   Heartburn 01/17/2019   Allergic rhinitis    Anxiety    Environmental allergies    Hypercholesteremia    Polyosteoarthritis    Polyarthritis with positive rheumatoid factor (HCC)    Low back pain    Lumbago with sciatica, left side    Dyspnea, unspecified    Neuropathy    GERD (gastroesophageal reflux disease)    Coronary artery disease involving native coronary artery of native heart with angina pectoris (HCC) 06/03/2016   Precordial pain 05/17/2016   Dyslipidemia 05/17/2016   Essential hypertension 05/17/2016   Hypokalemia 05/17/2016   SOB (shortness of breath) 05/17/2016   Tobacco abuse 05/17/2016   GAD (generalized anxiety disorder) 04/15/2016   Chronic bilateral low back pain with right-sided sciatica 03/05/2016   Abnormal perimenopausal bleeding 01/09/2015    Past Medical History:  Diagnosis Date   Abnormal perimenopausal bleeding 01/09/2015   Allergic rhinitis    Anxiety    Bilateral plantar fasciitis 10/29/2022   Chronic bilateral low back pain with right-sided sciatica 03/05/2016   Coronary artery disease involving native coronary artery of native heart with angina pectoris (HCC) 06/03/2016   (a) 05/2016 DES to LAD  and residual 60-70% stenosis in RCA   Dyslipidemia 05/17/2016   Dyspnea, unspecified    Environmental allergies    Essential hypertension 05/17/2016   GAD (generalized anxiety disorder) 04/15/2016   GERD (gastroesophageal reflux disease)    Heartburn 01/17/2019   Hypercholesteremia    Hyperlipidemia    Hypokalemia 05/17/2016   Low back pain    Lumbago with sciatica, left side    Neuropathy    Polyarthritis with positive rheumatoid factor (HCC)    Polyosteoarthritis    Precordial pain 05/17/2016   Rheumatoid arthritis with rheumatoid factor (HCC)    SOB (shortness of breath) 05/17/2016   Tobacco abuse 05/17/2016   Unstable angina (HCC) 09/02/2021    Family History  Problem Relation Age of Onset   Breast cancer Mother    Hypertension Mother    Hypertension Father    Kidney disease Father    Heart disease Father    Past Surgical History:  Procedure Laterality Date   bilateral tubal ligation     CARDIAC  CATHETERIZATION     Stent placed   CORONARY PRESSURE/FFR STUDY N/A 09/02/2021   Procedure: INTRAVASCULAR PRESSURE WIRE/FFR STUDY;  Surgeon: Lyn Records, MD;  Location: [facility];  Service: Cardiovascular;  Laterality: N/A;   CORONARY STENT INTERVENTION N/A 09/03/2021   Procedure: CORONARY STENT INTERVENTION;  Surgeon: Iran Ouch, MD;  Location: [facility];  Service: Cardiovascular;  Laterality: N/A;   LEFT HEART CATH AND CORONARY ANGIOGRAPHY N/A 09/02/2021   Procedure: LEFT HEART CATH AND CORONARY ANGIOGRAPHY;  Surgeon: Lyn Records, MD;  Location: [facility];  Service: Cardiovascular;  Laterality: N/A;   Social History   Social History Narrative   Not on file    There is no immunization history on file for this patient.   Objective: Vital Signs: There were no vitals taken for this visit.   Physical Exam   Musculoskeletal Exam: ***  CDAI Exam: CDAI Score: -- Patient Global: --; Provider Global: -- Swollen: --; Tender: -- Joint  Exam 08/27/2023   No joint exam has been documented for this visit   There is currently no information documented on the homunculus. Go to the Rheumatology activity and complete the homunculus joint exam.  Investigation: No additional findings.  Imaging: No results found.  Recent Labs: Lab Results  Component Value Date   WBC 4.0 07/20/2023   HGB 14.2 07/20/2023   PLT 178 07/20/2023   NA 141 07/20/2023   K 4.6 07/20/2023   CL 104 07/20/2023   CO2 28 07/20/2023   GLUCOSE 104 (H) 07/20/2023   BUN 24 07/20/2023   CREATININE 1.39 (H) 07/20/2023   BILITOT 0.5 07/20/2023   ALKPHOS 60 09/16/2021   AST 24 07/20/2023   ALT 17 07/20/2023   PROT 6.5 07/20/2023   ALBUMIN 4.0 09/16/2021   CALCIUM 9.3 07/20/2023   GFRAA 77 01/17/2019    Speciality Comments: No specialty comments available.  Procedures:  No procedures performed Allergies: Atorvastatin, Duloxetine, Sulfamethazine, Penicillins, Statins, Morphine, and Sulfa antibiotics   Assessment / Plan:     Visit Diagnoses: No diagnosis found.  ***  Orders: No orders of the defined types were placed in this encounter.  No orders of the defined types were placed in this encounter.    Follow-Up Instructions: No follow-ups on file.   Fuller Plan, MD  Note - This record has been created using [employer].  Chart creation errors have been sought, but may not always  have been located. Such creation errors do not reflect on  the standard of medical care.

## 2023-08-27 ENCOUNTER — Ambulatory Visit: Payer: Medicaid Other | Admitting: Internal Medicine

## 2024-07-04 ENCOUNTER — Other Ambulatory Visit: Payer: Self-pay | Admitting: Cardiology
